# Patient Record
Sex: Female | Born: 1990 | Race: Black or African American | Hispanic: No | Marital: Single | Smoking: Never smoker
Health system: Southern US, Community
[De-identification: ages and names within clinical notes are randomized; demographics above are authoritative.]

## PROBLEM LIST (undated history)

## (undated) ENCOUNTER — Emergency Department (HOSPITAL_BASED_OUTPATIENT_CLINIC_OR_DEPARTMENT_OTHER): Admission: EM | Payer: Medicaid Other | Source: Home / Self Care

## (undated) DIAGNOSIS — K219 Gastro-esophageal reflux disease without esophagitis: Secondary | ICD-10-CM

## (undated) DIAGNOSIS — D649 Anemia, unspecified: Secondary | ICD-10-CM

## (undated) DIAGNOSIS — J358 Other chronic diseases of tonsils and adenoids: Secondary | ICD-10-CM

## (undated) HISTORY — PX: TUBAL LIGATION: SHX77

## (undated) HISTORY — PX: TOOTH EXTRACTION: SUR596

## (undated) HISTORY — PX: WISDOM TOOTH EXTRACTION: SHX21

---

## 2013-10-28 ENCOUNTER — Encounter (HOSPITAL_BASED_OUTPATIENT_CLINIC_OR_DEPARTMENT_OTHER): Payer: Self-pay | Admitting: Emergency Medicine

## 2013-10-28 ENCOUNTER — Emergency Department (HOSPITAL_BASED_OUTPATIENT_CLINIC_OR_DEPARTMENT_OTHER)
Admission: EM | Admit: 2013-10-28 | Discharge: 2013-10-28 | Disposition: A | Discharge status: Home / Self Care | Payer: Medicaid Other | Attending: Emergency Medicine | Admitting: Emergency Medicine

## 2013-10-28 DIAGNOSIS — O9989 Other specified diseases and conditions complicating pregnancy, childbirth and the puerperium: Secondary | ICD-10-CM | POA: Insufficient documentation

## 2013-10-28 DIAGNOSIS — O0910 Supervision of pregnancy with history of ectopic or molar pregnancy, unspecified trimester: Secondary | ICD-10-CM | POA: Insufficient documentation

## 2013-10-28 DIAGNOSIS — O0912 Supervision of pregnancy with history of ectopic or molar pregnancy, second trimester: Secondary | ICD-10-CM

## 2013-10-28 DIAGNOSIS — R5381 Other malaise: Secondary | ICD-10-CM | POA: Insufficient documentation

## 2013-10-28 DIAGNOSIS — R5383 Other fatigue: Secondary | ICD-10-CM

## 2013-10-28 DIAGNOSIS — R110 Nausea: Secondary | ICD-10-CM | POA: Insufficient documentation

## 2013-10-28 DIAGNOSIS — R638 Other symptoms and signs concerning food and fluid intake: Secondary | ICD-10-CM | POA: Insufficient documentation

## 2013-10-28 LAB — URINALYSIS, ROUTINE W REFLEX MICROSCOPIC
BILIRUBIN URINE: NEGATIVE
Glucose, UA: NEGATIVE mg/dL
Hgb urine dipstick: NEGATIVE
KETONES UR: 15 mg/dL — AB
LEUKOCYTES UA: NEGATIVE
NITRITE: NEGATIVE
PH: 7 (ref 5.0–8.0)
Protein, ur: NEGATIVE mg/dL
Specific Gravity, Urine: 1.026 (ref 1.005–1.030)
UROBILINOGEN UA: 2 mg/dL — AB (ref 0.0–1.0)

## 2013-10-28 LAB — PREGNANCY, URINE: Preg Test, Ur: POSITIVE — AB

## 2013-10-28 NOTE — ED Notes (Signed)
MD at bedside.  Using ultrasound machine to verify age of fetus.  Single pregnancy, approximately 18 weeks in uterus.

## 2013-10-28 NOTE — Discharge Instructions (Signed)
Avoid alcohol or drugs. Tylenol for pain. Return for vaginal bleeding, passing out, abdominal pain or new concerns. See your OB this week.  If you were given medicines take as directed.  If you are on coumadin or contraceptives realize their levels and effectiveness is altered by many different medicines.  If you have any reaction (rash, tongues swelling, other) to the medicines stop taking and see a physician.   Please follow up as directed and return to the ER or see a physician for new or worsening symptoms.  Thank you. Filed Vitals:   10/28/13 1441  BP: 102/68  Pulse: 100  Temp: 97.8 F (36.6 C)  TempSrc: Oral  Resp: 18  SpO2: 100%

## 2013-10-28 NOTE — ED Provider Notes (Signed)
CSN: [id number]     Arrival date & time 10/28/13  1249 History   First MD Initiated Contact with Patient 10/28/13 1322     Chief Complaint  Patient presents with  . possibly pregnant, past tubal pregnancy      (Consider location/radiation/quality/duration/timing/severity/associated sxs/prior Treatment) HPI Comments: 23 yo female with ectopic pregnancy requiring medicine, C section hx presents with concern for pregnancy due to abdominal growth and nausea/ fatigue for the past few weeks.  No home preg test.  No abdo pain or bleeding.   No passing out.  Pt feels okay otherwise.  The history is provided by the patient.    History reviewed. No pertinent past medical history. History reviewed. No pertinent past surgical history. No family history on file. History  Substance Use Topics  . Smoking status: Never Smoker   . Smokeless tobacco: Not on file  . Alcohol Use: Not on file   OB History   Grav Para Term Preterm Abortions TAB SAB Ect Mult Living                 Review of Systems  Constitutional: Positive for appetite change and fatigue. Negative for fever and chills.  HENT: Negative for congestion.   Eyes: Negative for visual disturbance.  Respiratory: Negative for shortness of breath.   Cardiovascular: Negative for chest pain.  Gastrointestinal: Positive for nausea. Negative for vomiting and abdominal pain.  Genitourinary: Negative for dysuria.  Musculoskeletal: Negative for back pain, neck pain and neck stiffness.  Skin: Negative for rash.  Neurological: Negative for light-headedness and headaches.      Allergies  Review of patient's allergies indicates no known allergies.  Home Medications  No current outpatient prescriptions on file. BP 102/68  Pulse 100  Temp(Src) 97.8 F (36.6 C) (Oral)  Resp 18  SpO2 100%  LMP 08/19/2013 Physical Exam  Nursing note and vitals reviewed. Constitutional: She appears well-developed and well-nourished.  HENT:  Head:  Normocephalic and atraumatic.  Eyes: Conjunctivae are normal. Right eye exhibits no discharge. Left eye exhibits no discharge.  Neck: Normal range of motion. Neck supple. No tracheal deviation present.  Cardiovascular: Regular rhythm.   Pulmonary/Chest: Effort normal.  Abdominal: Soft. She exhibits no distension (gravid). There is no tenderness. There is no guarding.  Musculoskeletal: She exhibits no edema.  Neurological: She is alert.  Skin: Skin is warm. No rash noted.  Psychiatric: She has a normal mood and affect.    ED Course  Procedures (including critical care time) EMERGENCY DEPARTMENT US PREGNANCY "Study: Limited Ultrasound of the Pelvis for Pregnancy"  INDICATIONS:Pregnancy(required) nausea Multiple views of the uterus and pelvic cavity were obtained in real-time with a multi-frequency probe.  APPROACH:Transabdominal   PERFORMED BY: Myself  IMAGES ARCHIVED?: Yes   PREGNANCY FREE FLUID: None  ADNEXAL FINDINGS: no mass seen in adnexal region, difficult due to 2nd trimester pregnancy  PREGNANCY FINDINGS: Intrauterine gestational sac noted and Fetal heart activity seen  INTERPRETATION: Viable intrauterine pregnancy  GESTATIONAL AGE, ESTIMATE: 18 wks  FETAL HEART RATE: 160s     Labs Review Labs Reviewed  URINALYSIS, ROUTINE W REFLEX MICROSCOPIC - Abnormal; Notable for the following:    Ketones, ur 15 (*)    Urobilinogen, UA 2.0 (*)    All other components within normal limits  PREGNANCY, URINE - Abnormal; Notable for the following:    Preg Test, Ur POSITIVE (*)    All other components within normal limits   Imaging Review No results found.   EKG Interpretation  None      MDM   Final diagnoses:  Pregnancy in second trimester with history of ectopic pregnancy    Well appearing. Preg test positive, new pregnancy. No concern for ectopic at this time after bedside US showed normal intrauterine preg 18 wks, normal FHR.  Fup with OB.  Results and  differential diagnosis were discussed with the patient. Close follow up outpatient was discussed, patient comfortable with the plan.   Filed Vitals:   10/28/13 1441  BP: 102/68  Pulse: 100  Temp: 97.8 F (36.6 C)  TempSrc: Oral  Resp: 18  SpO2: 100%      Enid Skeens, MD 10/28/13 1450

## 2013-10-28 NOTE — ED Notes (Signed)
Patient here requesting pregnancy test, has history of tubal pregnancy. Reports fatigue and some vomiting.

## 2015-08-16 ENCOUNTER — Emergency Department (HOSPITAL_BASED_OUTPATIENT_CLINIC_OR_DEPARTMENT_OTHER)
Admission: EM | Admit: 2015-08-16 | Discharge: 2015-08-16 | Disposition: A | Discharge status: Home / Self Care | Payer: Medicaid Other | Attending: Emergency Medicine | Admitting: Emergency Medicine

## 2015-08-16 ENCOUNTER — Encounter (HOSPITAL_BASED_OUTPATIENT_CLINIC_OR_DEPARTMENT_OTHER): Payer: Self-pay | Admitting: Emergency Medicine

## 2015-08-16 DIAGNOSIS — Z862 Personal history of diseases of the blood and blood-forming organs and certain disorders involving the immune mechanism: Secondary | ICD-10-CM | POA: Diagnosis not present

## 2015-08-16 DIAGNOSIS — Z3202 Encounter for pregnancy test, result negative: Secondary | ICD-10-CM | POA: Diagnosis not present

## 2015-08-16 DIAGNOSIS — A5901 Trichomonal vulvovaginitis: Secondary | ICD-10-CM | POA: Diagnosis not present

## 2015-08-16 DIAGNOSIS — N898 Other specified noninflammatory disorders of vagina: Secondary | ICD-10-CM | POA: Diagnosis present

## 2015-08-16 HISTORY — DX: Anemia, unspecified: D64.9

## 2015-08-16 LAB — WET PREP, GENITAL
Clue Cells Wet Prep HPF POC: NONE SEEN
Sperm: NONE SEEN
Yeast Wet Prep HPF POC: NONE SEEN

## 2015-08-16 LAB — PREGNANCY, URINE: Preg Test, Ur: NEGATIVE

## 2015-08-16 MED ORDER — AZITHROMYCIN 250 MG PO TABS
1000.0000 mg | ORAL_TABLET | Freq: Once | ORAL | Status: AC
  Administered 2015-08-16: 1000 mg via ORAL
  Filled 2015-08-16: qty 4

## 2015-08-16 MED ORDER — CEFTRIAXONE SODIUM 250 MG IJ SOLR
250.0000 mg | Freq: Once | INTRAMUSCULAR | Status: AC
  Administered 2015-08-16: 250 mg via INTRAMUSCULAR
  Filled 2015-08-16: qty 250

## 2015-08-16 MED ORDER — METRONIDAZOLE 500 MG PO TABS
2000.0000 mg | ORAL_TABLET | Freq: Once | ORAL | Status: AC
  Administered 2015-08-16: 2000 mg via ORAL
  Filled 2015-08-16: qty 4

## 2015-08-16 MED ORDER — LIDOCAINE HCL (PF) 1 % IJ SOLN
2.0000 mL | Freq: Once | INTRAMUSCULAR | Status: AC
  Administered 2015-08-16: 2 mL
  Filled 2015-08-16: qty 5

## 2015-08-16 NOTE — Discharge Instructions (Signed)
Trichomoniasis Trichomoniasis is an infection caused by an organism called Trichomonas. The infection can affect both women and men. In women, the outer female genitalia and the vagina are affected. In men, the penis is mainly affected, but the prostate and other reproductive organs can also be involved. Trichomoniasis is a sexually transmitted infection (STI) and is most often passed to another person through sexual contact.  RISK FACTORS  Having unprotected sexual intercourse.  Having sexual intercourse with an infected partner. SIGNS AND SYMPTOMS  Symptoms of trichomoniasis in women include:  Abnormal gray-green frothy vaginal discharge.  Itching and irritation of the vagina.  Itching and irritation of the area outside the vagina. Symptoms of trichomoniasis in men include:   Penile discharge with or without pain.  Pain during urination. This results from inflammation of the urethra. DIAGNOSIS  Trichomoniasis may be found during a Pap test or physical exam. Your health care provider may use one of the following methods to help diagnose this infection:  Testing the pH of the vagina with a test tape.  Using a vaginal swab test that checks for the Trichomonas organism. A test is available that provides results within a few minutes.  Examining a urine sample.  Testing vaginal secretions. Your health care provider may test you for other STIs, including HIV. TREATMENT   You may be given medicine to fight the infection. Women should inform their health care provider if they could be or are pregnant. Some medicines used to treat the infection should not be taken during pregnancy.  Your health care provider may recommend over-the-counter medicines or creams to decrease itching or irritation.  Your sexual partner will need to be treated if infected.  Your health care provider may test you for infection again 3 months after treatment. HOME CARE INSTRUCTIONS   Take medicines only as  directed by your health care provider.  Take over-the-counter medicine for itching or irritation as directed by your health care provider.  Do not have sexual intercourse while you have the infection.  Women should not douche or wear tampons while they have the infection.  Discuss your infection with your partner. Your partner may have gotten the infection from you, or you may have gotten it from your partner.  Have your sex partner get examined and treated if necessary.  Practice safe, informed, and protected sex.  See your health care provider for other STI testing. SEEK MEDICAL CARE IF:   You still have symptoms after you finish your medicine.  You develop abdominal pain.  You have pain when you urinate.  You have bleeding after sexual intercourse.  You develop a rash.  Your medicine makes you sick or makes you throw up (vomit). MAKE SURE YOU:  Understand these instructions.  Will watch your condition.  Will get help right away if you are not doing well or get worse.   This information is not intended to replace advice given to you by your health care provider. Make sure you discuss any questions you have with your health care provider.   Document Released: 01/19/2001 Document Revised: 08/16/2014 Document Reviewed: 05/07/2013 Elsevier Interactive Patient Education 2016 [employer]  

## 2015-08-16 NOTE — ED Provider Notes (Addendum)
CSN: [id number]     Arrival date & time 08/16/15  0135 History   First MD Initiated Contact with Patient 08/16/15 0202     Chief Complaint  Patient presents with  . Foreign Body in Vagina     (Consider location/radiation/quality/duration/timing/severity/associated sxs/prior Treatment) HPI  This is a 25 year old female who is here with her cousin. Her cousin has a retained tampon in her vagina which was removed by myself a few minutes ago. She became concerned that she may have a retained tampon herself. She denies abnormal odor or discharge to me although she did mention abnormal odor to her nurse. She denies pain, nausea, vomiting or diarrhea.  Past Medical History  Diagnosis Date  . Anemia    Past Surgical History  Procedure Laterality Date  . Cesarean section     History reviewed. No pertinent family history. Social History  Substance Use Topics  . Smoking status: Never Smoker   . Smokeless tobacco: None  . Alcohol Use: Yes   OB History    Gravida Para Term Preterm AB TAB SAB Ectopic Multiple Living   7 6 5 1 1   1        Review of Systems  All other systems reviewed and are negative.   Allergies  Review of patient's allergies indicates no known allergies.  Home Medications   Prior to Admission medications   Not on File   BP 119/92 mmHg  Pulse 92  Temp(Src) 98.9 F (37.2 C) (Oral)  Resp 16  Ht 5\' 3"  (1.6 m)  Wt 127 lb (57.607 kg)  BMI 22.50 kg/m2  SpO2 100%   Physical Exam  General: Well-developed, well-nourished female in no acute distress; appearance consistent with age of record HENT: normocephalic; atraumatic Eyes: pupils equal, round and reactive to light; extraocular muscles intact Neck: supple Heart: regular rate and rhythm Lungs: clear to auscultation bilaterally Abdomen: soft; nondistended; nontender; no masses or hepatosplenomegaly; bowel sounds present GU: Normal external genitalia; physiologic appearing vaginal discharge; no vaginal  bleeding; no cervical motion tenderness; no cervical erythema; no adnexal tenderness Extremities: No deformity; full range of motion; pulses normal Neurologic: Awake, alert and oriented; motor function intact in all extremities and symmetric; no facial droop Skin: Warm and dry Psychiatric: Normal mood and affect    ED Course  Procedures (including critical care time)   MDM  Nursing notes and vitals signs, including pulse oximetry, reviewed.  Summary of this visit's results, reviewed by myself:  Labs:  Results for orders placed or performed during the hospital encounter of 08/16/15 (from the past 24 hour(s))  Wet prep, genital     Status: Abnormal   Collection Time: 08/16/15  2:00 AM  Result Value Ref Range   Yeast Wet Prep HPF POC NONE SEEN NONE SEEN   Trich, Wet Prep PRESENT (A) NONE SEEN   Clue Cells Wet Prep HPF POC NONE SEEN NONE SEEN   WBC, Wet Prep HPF POC MANY (A) NONE SEEN   Sperm NONE SEEN   Pregnancy, urine     Status: None   Collection Time: 08/16/15  2:30 AM  Result Value Ref Range   Preg Test, Ur NEGATIVE NEGATIVE      Paula LibraJohn Konnor Jorden, MD 08/16/15 [id number]  Paula LibraJohn Akash Winski, MD 08/16/15 [id number]

## 2015-08-16 NOTE — ED Notes (Signed)
Pt thinks she may have part of a tampoon in her vagina because of change in vaginal odor. Denies pain, discharge, or bleeding

## 2015-08-18 LAB — GC/CHLAMYDIA PROBE AMP (~~LOC~~) NOT AT ARMC
Chlamydia: NEGATIVE
Neisseria Gonorrhea: NEGATIVE

## 2015-08-18 LAB — RPR: RPR Ser Ql: NONREACTIVE

## 2015-08-18 LAB — HIV ANTIBODY (ROUTINE TESTING W REFLEX): HIV SCREEN 4TH GENERATION: NONREACTIVE

## 2016-02-12 ENCOUNTER — Emergency Department (HOSPITAL_BASED_OUTPATIENT_CLINIC_OR_DEPARTMENT_OTHER)
Admission: EM | Admit: 2016-02-12 | Discharge: 2016-02-12 | Disposition: A | Discharge status: Home / Self Care | Payer: Medicaid Other | Attending: Emergency Medicine | Admitting: Emergency Medicine

## 2016-02-12 ENCOUNTER — Encounter (HOSPITAL_BASED_OUTPATIENT_CLINIC_OR_DEPARTMENT_OTHER): Payer: Self-pay

## 2016-02-12 DIAGNOSIS — R5383 Other fatigue: Secondary | ICD-10-CM

## 2016-02-12 DIAGNOSIS — R42 Dizziness and giddiness: Secondary | ICD-10-CM | POA: Diagnosis present

## 2016-02-12 LAB — BASIC METABOLIC PANEL WITH GFR
Anion gap: 8 (ref 5–15)
BUN: 10 mg/dL (ref 6–20)
CO2: 23 mmol/L (ref 22–32)
Calcium: 9.2 mg/dL (ref 8.9–10.3)
Chloride: 106 mmol/L (ref 101–111)
Creatinine, Ser: 0.67 mg/dL (ref 0.44–1.00)
GFR calc Af Amer: >60 mL/min
GFR calc non Af Amer: >60 mL/min
Glucose, Bld: 97 mg/dL (ref 65–99)
Potassium: 3.4 mmol/L — ABNORMAL LOW (ref 3.5–5.1)
Sodium: 137 mmol/L (ref 135–145)

## 2016-02-12 LAB — URINALYSIS, ROUTINE W REFLEX MICROSCOPIC
Bilirubin Urine: NEGATIVE
Glucose, UA: NEGATIVE mg/dL
Hgb urine dipstick: NEGATIVE
KETONES UR: NEGATIVE mg/dL
LEUKOCYTES UA: NEGATIVE
NITRITE: NEGATIVE
PH: 7.5 (ref 5.0–8.0)
Protein, ur: NEGATIVE mg/dL
Specific Gravity, Urine: 1.022 (ref 1.005–1.030)

## 2016-02-12 LAB — CBC WITH DIFFERENTIAL/PLATELET
Basophils Absolute: 0 K/uL (ref 0.0–0.1)
Basophils Relative: 0 %
Eosinophils Absolute: 0.1 K/uL (ref 0.0–0.7)
Eosinophils Relative: 1 %
HCT: 30.3 % — ABNORMAL LOW (ref 36.0–46.0)
Hemoglobin: 9.5 g/dL — ABNORMAL LOW (ref 12.0–15.0)
Lymphocytes Relative: 27 %
Lymphs Abs: 2.3 K/uL (ref 0.7–4.0)
MCH: 23.7 pg — ABNORMAL LOW (ref 26.0–34.0)
MCHC: 31.4 g/dL (ref 30.0–36.0)
MCV: 75.6 fL — ABNORMAL LOW (ref 78.0–100.0)
Monocytes Absolute: 0.6 K/uL (ref 0.1–1.0)
Monocytes Relative: 7 %
Neutro Abs: 5.6 K/uL (ref 1.7–7.7)
Neutrophils Relative %: 65 %
Platelets: 409 K/uL — ABNORMAL HIGH (ref 150–400)
RBC: 4.01 MIL/uL (ref 3.87–5.11)
RDW: 17.8 % — ABNORMAL HIGH (ref 11.5–15.5)
WBC: 8.7 K/uL (ref 4.0–10.5)

## 2016-02-12 LAB — PREGNANCY, URINE: Preg Test, Ur: NEGATIVE

## 2016-02-12 MED ORDER — SODIUM CHLORIDE 0.9 % IV BOLUS (SEPSIS)
500.0000 mL | Freq: Once | INTRAVENOUS | Status: AC
  Administered 2016-02-12: 500 mL via INTRAVENOUS

## 2016-02-12 NOTE — ED Provider Notes (Signed)
CSN: [id number]     Arrival date & time 02/12/16  1132 History   First MD Initiated Contact with Patient 02/12/16 1241     Chief Complaint  Patient presents with  . Dizziness     (Consider location/radiation/quality/duration/timing/severity/associated sxs/prior Treatment) HPI   Tempie DonningJasmine Ergle is a 25 yo female with past medical history of anemia who presents to the ED today with multiple complaints. She states that she feels like she is dehydrated. When asked what she means by that patient responds by saying "I have had intermittent headaches and when I drink water they go away." She does not currently have a headache. Patient also states she has a history of anemia and takes iron supplements for this. Her last dose was yesterday. She also reports feeling very fatigued. She denies any shortness of breath or loss of consciousness. Lastly, patient states that her right index finger has been intermittently feeling numb for the last 2 days. She denies any trauma or injury to that area. She describes the numbness as feeling like "it is asleep". She denies any fevers, chills, nausea, vomiting, diarrhea, dizziness, blurry vision, neck pain or stiffness.  Past Medical History  Diagnosis Date  . Anemia    Past Surgical History  Procedure Laterality Date  . Cesarean section     No family history on file. Social History  Substance Use Topics  . Smoking status: Never Smoker   . Smokeless tobacco: None  . Alcohol Use: Yes     Comment: occ   OB History    Gravida Para Term Preterm AB TAB SAB Ectopic Multiple Living   7 6 5 1 1   1        Review of Systems  All other systems reviewed and are negative.     Allergies  Review of patient's allergies indicates no known allergies.  Home Medications   Prior to Admission medications   Medication Sig Start Date End Date Taking? Authorizing Provider  IRON PO Take by mouth.   Yes Historical Provider, MD   BP 113/74 mmHg  Pulse 84  Temp(Src)  98.8 F (37.1 C) (Oral)  Resp 16  Ht 5\' 3"  (1.6 m)  Wt 69.854 kg  BMI 27.29 kg/m2  SpO2 100%  LMP 01/29/2016 Physical Exam  Constitutional: She is oriented to person, place, and time. She appears well-developed and well-nourished. No distress.  HENT:  Head: Normocephalic and atraumatic.  Mouth/Throat: No oropharyngeal exudate.  Eyes: Conjunctivae and EOM are normal. Pupils are equal, round, and reactive to light. Right eye exhibits no discharge. Left eye exhibits no discharge. No scleral icterus.  Cardiovascular: Normal rate, regular rhythm, normal heart sounds and intact distal pulses.  Exam reveals no gallop and no friction rub.   No murmur heard. Pulmonary/Chest: Effort normal and breath sounds normal. No respiratory distress. She has no wheezes. She has no rales. She exhibits no tenderness.  Abdominal: Soft. She exhibits no distension. There is no tenderness. There is no guarding.  Musculoskeletal: Normal range of motion. She exhibits no edema.  Good capillary refill.   Neurological: She is alert and oriented to person, place, and time. No cranial nerve deficit. Coordination normal.  Strength 5/5 throughout. No sensory deficits.     Skin: Skin is warm and dry. No rash noted. She is not diaphoretic. No erythema. No pallor.  Psychiatric: She has a normal mood and affect. Her behavior is normal.  Nursing note and vitals reviewed.   ED Course  Procedures (including critical  care time) Labs Review Labs Reviewed  CBC WITH DIFFERENTIAL/PLATELET - Abnormal; Notable for the following:    Hemoglobin 9.5 (*)    HCT 30.3 (*)    MCV 75.6 (*)    MCH 23.7 (*)    RDW 17.8 (*)    Platelets 409 (*)    All other components within normal limits  BASIC METABOLIC PANEL - Abnormal; Notable for the following:    Potassium 3.4 (*)    All other components within normal limits  PREGNANCY, URINE  URINALYSIS, ROUTINE W REFLEX MICROSCOPIC (NOT AT [facility])    Imaging Review No results found. I  have personally reviewed and evaluated these images and lab results as part of my medical decision-making.   EKG Interpretation None      MDM   Final diagnoses:  Other fatigue   25 y.o F with a pmhx of anemia presents to the ED c/o fatigue and "feeling dehydrated". Pt appears well in ED, in NAD. All VSS. No pallor. No neurological deficits. No clinical signs of dehydration. Good skin turgor. Mucous membranes are moist. No metabolic derangement. Normal specific gravity and creatinine. No lab evidence of dehydration. Hgb is 9.5. Upon review of pts records this is increased from previous lab results. Pts baseline is around 9. No indication for transfusion. Pt was given 500cc bolus of NS in ED and reports symptomatic improvement. Feel that pt is safe for discharge with PCP follow up. Recommend that pt continue home iron supplementation.   Pt also c/o R 1st digit intermittent numbness x 4 days. However, no sensory deficits on exam. Finger is not currently numb. No discoloration. Good cap refill. NO hx of injury. Do not feel imaging is indicated at this point. Recommend follow up with PCP.     Lester KinsmanSamantha Tripp MarquetteDowless, PA-C 02/14/16 [id number]  Lavera Guiseana Duo Liu, MD 02/17/16 [id number]

## 2016-02-12 NOTE — ED Notes (Addendum)
C/o lightheaded, nausea, numbness to right index finger x 4 days-NAD-texting in triage-steady gait

## 2016-02-12 NOTE — Discharge Instructions (Signed)
Fatigue Fatigue is feeling tired all of the time, a lack of energy, or a lack of motivation. Occasional or mild fatigue is often a normal response to activity or life in general. However, long-lasting (chronic) or extreme fatigue may indicate an underlying medical condition. HOME CARE INSTRUCTIONS  Watch your fatigue for any changes. The following actions may help to lessen any discomfort you are feeling:  Talk to your health care provider about how much sleep you need each night. Try to get the required amount every night.  Take medicines only as directed by your health care provider.  Eat a healthy and nutritious diet. Ask your health care provider if you need help changing your diet.  Drink enough fluid to keep your urine clear or pale yellow.  Practice ways of relaxing, such as yoga, meditation, massage therapy, or acupuncture.  Exercise regularly.   Change situations that cause you stress. Try to keep your work and personal routine reasonable.  Do not abuse illegal drugs.  Limit alcohol intake to no more than 1 drink per day for nonpregnant women and 2 drinks per day for men. One drink equals 12 ounces of beer, 5 ounces of wine, or 1 ounces of hard liquor.  Take a multivitamin, if directed by your health care provider. SEEK MEDICAL CARE IF:   Your fatigue does not get better.  You have a fever.   You have unintentional weight loss or gain.  You have headaches.   You have difficulty:   Falling asleep.  Sleeping throughout the night.  You feel angry, guilty, anxious, or sad.   You are unable to have a bowel movement (constipation).   You skin is dry.   Your legs or another part of your body is swollen.  SEEK IMMEDIATE MEDICAL CARE IF:   You feel confused.   Your vision is blurry.  You feel faint or pass out.   You have a severe headache.   You have severe abdominal, pelvic, or back pain.   You have chest pain, shortness of breath, or an  irregular or fast heartbeat.   You are unable to urinate or you urinate less than normal.   You develop abnormal bleeding, such as bleeding from the rectum, vagina, nose, lungs, or nipples.  You vomit blood.   You have thoughts about harming yourself or committing suicide.   You are worried that you might harm someone else.    This information is not intended to replace advice given to you by your health care provider. Make sure you discuss any questions you have with your health care provider.   Continue home iron supplements. Encourage adequate hydration, drink plenty of fluids. Follow up with your PCP as needed. Return to the ED if you experience severe worsening of your symptoms, loss of consciousness, difficulty breathing, change in vision.

## 2016-11-03 ENCOUNTER — Emergency Department (HOSPITAL_BASED_OUTPATIENT_CLINIC_OR_DEPARTMENT_OTHER)
Admission: EM | Admit: 2016-11-03 | Discharge: 2016-11-04 | Disposition: A | Discharge status: Home / Self Care | Payer: Medicaid Other | Attending: Emergency Medicine | Admitting: Emergency Medicine

## 2016-11-03 ENCOUNTER — Encounter (HOSPITAL_BASED_OUTPATIENT_CLINIC_OR_DEPARTMENT_OTHER): Payer: Self-pay

## 2016-11-03 DIAGNOSIS — M545 Low back pain: Secondary | ICD-10-CM

## 2016-11-03 DIAGNOSIS — Y999 Unspecified external cause status: Secondary | ICD-10-CM | POA: Insufficient documentation

## 2016-11-03 DIAGNOSIS — Y92410 Unspecified street and highway as the place of occurrence of the external cause: Secondary | ICD-10-CM | POA: Insufficient documentation

## 2016-11-03 DIAGNOSIS — Y939 Activity, unspecified: Secondary | ICD-10-CM | POA: Insufficient documentation
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
## 2017-03-10 ENCOUNTER — Emergency Department (HOSPITAL_COMMUNITY)
Admission: EM | Admit: 2017-03-10 | Discharge: 2017-03-10 | Disposition: A | Discharge status: Home / Self Care | Payer: Medicaid Other | Attending: Physician Assistant | Admitting: Physician Assistant

## 2017-03-10 ENCOUNTER — Encounter (HOSPITAL_COMMUNITY): Payer: Self-pay | Admitting: Emergency Medicine

## 2017-03-10 DIAGNOSIS — T50905A Adverse effect of unspecified drugs, medicaments and biological substances, initial encounter: Secondary | ICD-10-CM

## 2017-03-10 DIAGNOSIS — N390 Urinary tract infection, site not specified: Secondary | ICD-10-CM | POA: Insufficient documentation

## 2017-03-10 DIAGNOSIS — N3001 Acute cystitis with hematuria: Secondary | ICD-10-CM

## 2017-03-10 LAB — POC URINE PREG, ED: Preg Test, Ur: NEGATIVE

## 2017-03-10 LAB — URINALYSIS, ROUTINE W REFLEX MICROSCOPIC
BACTERIA UA: NONE SEEN
BILIRUBIN URINE: NEGATIVE
Glucose, UA: NEGATIVE mg/dL
Ketones, ur: 20 mg/dL — AB
Nitrite: NEGATIVE
PH: 7 (ref 5.0–8.0)
Protein, ur: NEGATIVE mg/dL
Specific Gravity, Urine: 1.003 — ABNORMAL LOW (ref 1.005–1.030)

## 2017-03-10 LAB — CBC WITH DIFFERENTIAL/PLATELET
Basophils Absolute: 0 10*3/uL (ref 0.0–0.1)
Basophils Relative: 0 %
EOS ABS: 0.1 10*3/uL (ref 0.0–0.7)
EOS PCT: 1 %
HCT: 31.8 % — ABNORMAL LOW (ref 36.0–46.0)
Hemoglobin: 10.2 g/dL — ABNORMAL LOW (ref 12.0–15.0)
LYMPHS ABS: 1.7 10*3/uL (ref 0.7–4.0)
LYMPHS PCT: 20 %
MCH: 24.2 pg — AB (ref 26.0–34.0)
MCHC: 32.1 g/dL (ref 30.0–36.0)
MCV: 75.4 fL — AB (ref 78.0–100.0)
MONOS PCT: 8 %
Monocytes Absolute: 0.7 10*3/uL (ref 0.1–1.0)
Neutro Abs: 6.1 10*3/uL (ref 1.7–7.7)
Neutrophils Relative %: 71 %
PLATELETS: 371 10*3/uL (ref 150–400)
RBC: 4.22 MIL/uL (ref 3.87–5.11)
RDW: 17.3 % — ABNORMAL HIGH (ref 11.5–15.5)
WBC: 8.5 10*3/uL (ref 4.0–10.5)

## 2017-03-10 LAB — COMPREHENSIVE METABOLIC PANEL
ALT: 11 U/L — AB (ref 14–54)
ANION GAP: 9 (ref 5–15)
AST: 22 U/L (ref 15–41)
Albumin: 4 g/dL (ref 3.5–5.0)
Alkaline Phosphatase: 65 U/L (ref 38–126)
BUN: 9 mg/dL (ref 6–20)
CO2: 22 mmol/L (ref 22–32)
CREATININE: 0.72 mg/dL (ref 0.44–1.00)
Calcium: 9.1 mg/dL (ref 8.9–10.3)
Chloride: 106 mmol/L (ref 101–111)
GFR calc Af Amer: >60 mL/min (ref >60)
Glucose, Bld: 93 mg/dL (ref 65–99)
Potassium: 3.2 mmol/L — ABNORMAL LOW (ref 3.5–5.1)
SODIUM: 137 mmol/L (ref 135–145)
TOTAL PROTEIN: 8.3 g/dL — AB (ref 6.5–8.1)
Total Bilirubin: 0.4 mg/dL (ref 0.3–1.2)

## 2017-03-10 MED ORDER — CEPHALEXIN 250 MG/5ML PO SUSR
500.0000 mg | Freq: Once | ORAL | Status: AC
  Administered 2017-03-10: 500 mg via ORAL
  Filled 2017-03-10: qty 10

## 2017-03-10 MED ORDER — NITROFURANTOIN MONOHYD MACRO 100 MG PO CAPS: 100.0000 mg | ORAL_CAPSULE | Freq: Two times a day (BID) | ORAL | 0 refills | Status: DC

## 2017-03-10 MED ORDER — NITROFURANTOIN MONOHYD MACRO 100 MG PO CAPS
100.0000 mg | ORAL_CAPSULE | Freq: Once | ORAL | Status: DC
  Filled 2017-03-10: qty 1

## 2017-03-10 MED ORDER — CEPHALEXIN 250 MG/5ML PO SUSR: 500.0000 mg | Freq: Four times a day (QID) | ORAL | 0 refills | Status: AC

## 2017-03-10 NOTE — Discharge Instructions (Signed)
Your hemoglobin is 10.1. You should continue to take iron pills at home.

## 2017-03-10 NOTE — ED Notes (Signed)
Bed: WA17 Expected date:  Expected time:  Means of arrival:  Comments: EMS 

## 2017-03-10 NOTE — ED Provider Notes (Signed)
[facility]-EMERGENCY DEPT Provider Note   CSN: [id number] Arrival date & time: 03/10/17  1633     History   Chief Complaint Chief Complaint  Patient presents with  . Medication Reaction    HPI Kristi Shelton is a 26 y.o. female.  HPI   Patient's a 26 year old female presenting with urinary tract symptoms. She reports that she had burning with urination. She reports that she took her friend's amoxicillin. She took it at 11 AM, 6 hours ago. She reports 2 hours later she had chest tightness and numbness of all extremities short of breath. She reports it resolved after couple hours. She reports no itching no rash.  Past Medical History:  Diagnosis Date  . Anemia     There are no active problems to display for this patient.   Past Surgical History:  Procedure Laterality Date  . CESAREAN SECTION    . TOOTH EXTRACTION    . WISDOM TOOTH EXTRACTION      OB History    Gravida Para Term Preterm AB Living   7 6 5 1 1      SAB TAB Ectopic Multiple Live Births       1           Home Medications    Prior to Admission medications   Medication Sig Start Date End Date Taking? Authorizing Provider  amoxicillin (AMOXIL) 250 MG chewable tablet Chew 1 mg by mouth once.   Yes [provider]    Family History No family history on file.  Social History Social History  Substance Use Topics  . Smoking status: Never Smoker  . Smokeless tobacco: Never Used  . Alcohol use No     Comment: weekly     Allergies   Patient has no known allergies.   Review of Systems Review of Systems  Constitutional: Negative for activity change.  Respiratory: Negative for shortness of breath.   Cardiovascular: Negative for chest pain.  Gastrointestinal: Negative for abdominal pain.     Physical Exam Updated Vital Signs BP 109/70 (BP Location: Right Arm)   Pulse 60   Temp 97.9 F (36.6 C) (Oral)   Resp 18   Ht 5\' 2"  (1.575 m)   Wt 69.9 kg (154 lb)   SpO2 100%   BMI 28.17  kg/m   Physical Exam  Constitutional: She is oriented to person, place, and time. She appears well-developed and well-nourished.  HENT:  Head: Normocephalic and atraumatic.  Eyes: Right eye exhibits no discharge.  Cardiovascular: Normal rate, regular rhythm and normal heart sounds.   No murmur heard. Pulmonary/Chest: Effort normal and breath sounds normal. She has no wheezes. She has no rales.  Abdominal: Soft. She exhibits no distension. There is no tenderness.  Neurological: She is oriented to person, place, and time.  Skin: Skin is warm and dry. She is not diaphoretic.  Psychiatric: She has a normal mood and affect.  Nursing note and vitals reviewed.    ED Treatments / Results  Labs (all labs ordered are listed, but only abnormal results are displayed) Labs Reviewed  URINALYSIS, ROUTINE W REFLEX MICROSCOPIC  CBC WITH DIFFERENTIAL/PLATELET  COMPREHENSIVE METABOLIC PANEL  POC URINE PREG, ED    EKG  EKG Interpretation None       Radiology No results found.  Procedures Procedures (including critical care time)  Medications Ordered in ED Medications - No data to display   Initial Impression / Assessment and Plan / ED Course  I have reviewed the triage vital  signs and the nursing notes.  Pertinent labs & imaging results that were available during my care of the patient were reviewed by me and considered in my medical decision making (see chart for details).     Patient's healthy appearing 26 year old female presenting with reaction to amoxicillin. She reports that she thinks she had allergic reaction. Sounds very atypical for an allergic reaction given there is no rash or itching involved. We will check her urine and prescribe a different antibiotic. Patient requesting us to do blood work as well because she is concerned that she has a low hemoglobin because she's had in the past.  Patient has mildly low hemoglobin (better than usual) total encourage her to continue  to her iron pills and recheck. Also patient has urinary tract infection. She reports she cannot take pills we'll prescribe liquid Keflex.  Final Clinical Impressions(s) / ED Diagnoses   Final diagnoses:  None    New Prescriptions New Prescriptions   No medications on file     Abelino DerrickMackuen, Anwar Sakata Lyn, MD 03/10/17 2227

## 2017-03-10 NOTE — ED Triage Notes (Signed)
Per EMS, pt is coming from home with complaints of a medication allergic reaction. Pt took 250 mg amoxicillin approx. 5 hours ago. Pt reports that the medication belonged to a friend and she started taking it because she suspected a UTI. Pt reports symptoms of foul smelling urine. Pt states she has had SOB, dry throat, and lightheadedness. EMS reports lung sounds clear in all fields. No hives. No wheezing.

## 2017-09-06 ENCOUNTER — Other Ambulatory Visit: Payer: Self-pay

## 2017-09-06 ENCOUNTER — Emergency Department (HOSPITAL_COMMUNITY)
Admission: EM | Admit: 2017-09-06 | Discharge: 2017-09-06 | Disposition: A | Discharge status: Home / Self Care | Payer: Medicaid Other | Attending: Emergency Medicine | Admitting: Emergency Medicine

## 2017-09-06 ENCOUNTER — Encounter (HOSPITAL_COMMUNITY): Payer: Self-pay | Admitting: *Deleted

## 2017-09-06 ENCOUNTER — Emergency Department (HOSPITAL_COMMUNITY): Payer: Medicaid Other

## 2017-09-06 DIAGNOSIS — D649 Anemia, unspecified: Secondary | ICD-10-CM | POA: Diagnosis not present

## 2017-09-06 DIAGNOSIS — J181 Lobar pneumonia, unspecified organism: Secondary | ICD-10-CM | POA: Diagnosis not present

## 2017-09-06 DIAGNOSIS — J189 Pneumonia, unspecified organism: Secondary | ICD-10-CM

## 2017-09-06 DIAGNOSIS — Z79899 Other long term (current) drug therapy: Secondary | ICD-10-CM | POA: Insufficient documentation

## 2017-09-06 DIAGNOSIS — R0600 Dyspnea, unspecified: Secondary | ICD-10-CM | POA: Diagnosis present

## 2017-09-06 LAB — URINALYSIS, ROUTINE W REFLEX MICROSCOPIC
Bilirubin Urine: NEGATIVE
Glucose, UA: NEGATIVE mg/dL
Ketones, ur: NEGATIVE mg/dL
Leukocytes, UA: NEGATIVE
Nitrite: NEGATIVE
Protein, ur: NEGATIVE mg/dL
Specific Gravity, Urine: 1.024 (ref 1.005–1.030)
pH: 6 (ref 5.0–8.0)

## 2017-09-06 LAB — WET PREP, GENITAL
Clue Cells Wet Prep HPF POC: NONE SEEN
SPERM: NONE SEEN
Trich, Wet Prep: NONE SEEN
Yeast Wet Prep HPF POC: NONE SEEN

## 2017-09-06 LAB — CBC
HCT: 34.3 % — ABNORMAL LOW (ref 36.0–46.0)
Hemoglobin: 10.7 g/dL — ABNORMAL LOW (ref 12.0–15.0)
MCH: 24.3 pg — ABNORMAL LOW (ref 26.0–34.0)
MCHC: 31.2 g/dL (ref 30.0–36.0)
MCV: 77.8 fL — ABNORMAL LOW (ref 78.0–100.0)
Platelets: 366 K/uL (ref 150–400)
RBC: 4.41 MIL/uL (ref 3.87–5.11)
RDW: 17.4 % — ABNORMAL HIGH (ref 11.5–15.5)
WBC: 9 K/uL (ref 4.0–10.5)

## 2017-09-06 LAB — I-STAT BETA HCG BLOOD, ED (MC, WL, AP ONLY): I-stat hCG, quantitative: <5 m[IU]/mL

## 2017-09-06 MED ORDER — DOXYCYCLINE HYCLATE 100 MG PO CAPS: 100.0000 mg | ORAL_CAPSULE | Freq: Two times a day (BID) | ORAL | 0 refills | Status: AC

## 2017-09-06 MED ORDER — FERROUS SULFATE 325 (65 FE) MG PO TABS: 325.0000 mg | ORAL_TABLET | Freq: Every day | ORAL | 0 refills | Status: DC

## 2017-09-06 NOTE — Discharge Instructions (Signed)
Your x-ray showed a developing right middle lobe pneumonia.  Please take 1 tablet of doxycycline twice daily for the next 5 days.  Take 1 tablet of ferrous sulfate (iron) daily for the next 30 days.  These call and schedule a follow-up in the next month with your OB/GYN.  They may want to recheck your hemoglobin level.  You may also want to discuss different types of birth control if your periods continue to be heavier.  Take 600 mg of ibuprofen with food every 6 hours as needed to help with abdominal cramping. There are several anti-gas products available over the counter to help with increased belching and gas, such as Gas-X  Your gonorrhea and Chlamydia test are pending.  If 1 of these tests are positive, someone from the hospital will call you with a number that you gave registration.  You can also download the app MyChart if you have a smartphone and these results should be available in a few days.   If you develop new or worsening symptoms, including dizziness or lightheadedness, worsening shortness of breath or chest pain, or other new concerning symptoms, please return to the emergency department for reevaluation.

## 2017-09-06 NOTE — ED Notes (Signed)
Pelvic cart at pt's bedside. 

## 2017-09-06 NOTE — ED Notes (Signed)
Pt had steady gait, while ambulating to the bathroom. 

## 2017-09-06 NOTE — ED Notes (Signed)
Pelvic cart at bedside. 

## 2017-09-06 NOTE — ED Notes (Signed)
Pelvic exam done by Mia - PA and Kenney Housemananya - EMT assisted

## 2017-09-06 NOTE — ED Triage Notes (Signed)
Pt is here stating her breathing is acting up, concern for uti or std, and thinks when her menstrual came on it was everywhere.  Pt is having pain in her chest and having a gassy feeling, dizziness, and dry mouth.  Pt reports vaginal odor.

## 2017-09-06 NOTE — ED Provider Notes (Signed)
[facility] EMERGENCY DEPARTMENT Provider Note   CSN: [id number] Arrival date & time: 09/06/17  1030     History   Chief Complaint Chief Complaint  Patient presents with  . Multiple Complaints    HPI Kristi Shelton is a 27 y.o. female with a h/o of anemia who presents to the emergency department with multiple complaints.  She endorses intermittent dyspnea over the last week that is worse at night and when she is laying flat on her right side and is alleviated by standing up and walking around.  The pain radiates to her right mid back when she lays on her right side.  She reports associated chest tightness, intermittently productive cough, and worsening pica. She has been eating ice constantly ovre the last week.  She denies palpitations, chest pain, dizziness, lightheadedness, headache,   She also complains of heavy vaginal bleeding x4 days. She has been passing clots.  She is unsure how many pads that she has been using because she changes them frequently. she reports associated abdominal cramping,and increased belching, and flatulence.  She reports malodorous vaginal discharge 4 days ago that has since resolved.  She denies dysuria, frequency, hematuria, dyspareunia, hematochezia, or melena.   The history is provided by the patient. No language interpreter was used.    Past Medical History:  Diagnosis Date  . Anemia     There are no active problems to display for this patient.   Past Surgical History:  Procedure Laterality Date  . CESAREAN SECTION    . TOOTH EXTRACTION    . WISDOM TOOTH EXTRACTION      OB History    Gravida Para Term Preterm AB Living   7 6 5 1 1      SAB TAB Ectopic Multiple Live Births       1           Home Medications    Prior to Admission medications   Medication Sig Start Date End Date Taking? Authorizing Provider  amoxicillin (AMOXIL) 250 MG chewable tablet Chew 1 mg by mouth once.    [provider]    doxycycline (VIBRAMYCIN) 100 MG capsule Take 1 capsule (100 mg total) by mouth 2 (two) times daily for 5 days. 09/06/17 09/11/17  Jannice Beitzel A, PA-C  ferrous sulfate 325 (65 FE) MG tablet Take 1 tablet (325 mg total) by mouth daily. 09/06/17   Kingston Guiles A, PA-C  nitrofurantoin, macrocrystal-monohydrate, (MACROBID) 100 MG capsule Take 1 capsule (100 mg total) by mouth 2 (two) times daily. 03/10/17   Mackuen, Cindee Salt, MD    Family History No family history on file.  Social History Social History   Tobacco Use  . Smoking status: Never Smoker  . Smokeless tobacco: Never Used  Substance Use Topics  . Alcohol use: No    Comment: weekly  . Drug use: No     Allergies   Penicillins   Review of Systems Review of Systems  Constitutional: Negative for activity change.  Respiratory: Positive for cough, chest tightness and shortness of breath.   Cardiovascular: Negative for chest pain.  Gastrointestinal: Positive for abdominal pain and diarrhea. Negative for constipation, nausea and vomiting.  Genitourinary: Positive for vaginal bleeding.  Musculoskeletal: Positive for back pain.  Skin: Negative for rash.   Physical Exam Updated Vital Signs BP 128/64 (BP Location: Right Arm)   Pulse 79   Temp 98.2 F (36.8 C) (Oral)   Resp 18   LMP 09/02/2017   SpO2  100%   Physical Exam  Constitutional: She appears well-developed and well-nourished. No distress.  HENT:  Head: Normocephalic.  Right Ear: Tympanic membrane and ear canal normal.  Left Ear: Tympanic membrane and ear canal normal.  Nose: Rhinorrhea present.  Mouth/Throat: Uvula is midline, oropharynx is clear and moist and mucous membranes are normal.  Eyes: Conjunctivae are normal.  Neck: Normal range of motion. Neck supple.  Cardiovascular: Normal rate, regular rhythm, normal heart sounds and intact distal pulses. Exam reveals no gallop and no friction rub.  No murmur heard. Pulmonary/Chest: Effort normal. No stridor.  No respiratory distress. She has no wheezes. She has no rales.  Abdominal: Soft. Bowel sounds are normal. She exhibits no distension and no mass. There is no tenderness. There is no rebound and no guarding. No hernia.  Abdomen is soft, nontender, nondistended.  Normoactive bowel sounds.  Genitourinary: Uterus normal. There is no rash, tenderness or lesion on the right labia. There is no rash, tenderness or lesion on the left labia. Cervix exhibits no motion tenderness and no friability. Right adnexum displays no mass, no tenderness and no fullness. Left adnexum displays no mass, no tenderness and no fullness. There is bleeding in the vagina.  Genitourinary Comments: No external rash or lesions, edema, erythema, or warmth. No cervical motion tenderness.  No adnexal tenderness bilaterally. Dark red blood and several clots are noted in the vaginal vault.   Musculoskeletal: Normal range of motion. She exhibits no edema, tenderness or deformity.  Neurological: She is alert.  Skin: Skin is warm. No rash noted. She is not diaphoretic.  Psychiatric: Her behavior is normal.  Nursing note and vitals reviewed.    ED Treatments / Results  Labs (all labs ordered are listed, but only abnormal results are displayed) Labs Reviewed  WET PREP, GENITAL - Abnormal; Notable for the following components:      Result Value   WBC, Wet Prep HPF POC FEW (*)    All other components within normal limits  URINALYSIS, ROUTINE W REFLEX MICROSCOPIC - Abnormal; Notable for the following components:   Hgb urine dipstick MODERATE (*)    Bacteria, UA RARE (*)    Squamous Epithelial / LPF 0-5 (*)    All other components within normal limits  CBC - Abnormal; Notable for the following components:   Hemoglobin 10.7 (*)    HCT 34.3 (*)    MCV 77.8 (*)    MCH 24.3 (*)    RDW 17.4 (*)    All other components within normal limits  RPR  HIV ANTIBODY (ROUTINE TESTING)  I-STAT BETA HCG BLOOD, ED ([facility], [facility], [facility] ONLY)  GC/CHLAMYDIA  PROBE AMP ([place]    EKG  EKG Interpretation None       Radiology Dg Chest 2 View  Result Date: 09/06/2017 CLINICAL DATA:  Shortness of breath. EXAM: CHEST  2 VIEW COMPARISON:  No prior. FINDINGS: Mediastinum and hilar structures normal. Heart size normal. Mild infiltrate right mid lung field cannot be completely excluded. No pleural effusion or pneumothorax. Heart size normal. IMPRESSION: Heart size normal. Mild infiltrate right mid lung field cannot be completely excluded. Exam otherwise unremarkable. Electronically Signed   By: Maisie Fushomas  Register   On: 09/06/2017 11:46    Procedures Procedures (including critical care time)  Medications Ordered in ED Medications - No data to display   Initial Impression / Assessment and Plan / ED Course  I have reviewed the triage vital signs and the nursing notes.  Pertinent labs & imaging results that were available during my care of the patient were reviewed by me and considered in my medical decision making (see chart for details).     27 year old female with a history of anemia presenting with multiple complaints including dyspnea, vaginal bleeding, abdominal cramping, flatulence, belching, and malodorous vaginal discharged.  Chest x-ray demonstrating possible developing right middle lobe pneumonia, which corresponds clinically with the patient's symptoms. EKS with NSR. Will discharge the patient with a 5-day course of doxycycline.  Pelvic exam is unremarkable other than vaginal bleeding.  Wet prep with few WBCs, but otherwise negative. HIV, RPR, GC/chlamdyia are pending.  Discussed treatment for gonorrhea and chlamydia with the patient who declines at this time.  Discussed this someone from the hospital will call her if the results are positive. Doubt PID at this time.   Microcytic anemia with hemoglobin of 10.7.  Last hemoglobin 10.2.  We will discharge the patient with ferrous sulfate and recommended follow-up to her  OB/GYN for re-evaluation in the next 30 days.   Strict return precautions given.  No acute distress.  The patient is safe for discharge at this time.   Final Clinical Impressions(s) / ED Diagnoses   Final diagnoses:  Community acquired pneumonia of right middle lobe of lung (HCC)  Anemia, unspecified type    ED Discharge Orders        Ordered    doxycycline (VIBRAMYCIN) 100 MG capsule  2 times daily     09/06/17 1451    ferrous sulfate 325 (65 FE) MG tablet  Daily     09/06/17 1451       Lanesha Azzaro A, PA-C 09/06/17 1501    Tilden Fossa, MD 09/06/17 [phone]

## 2017-09-07 LAB — GC/CHLAMYDIA PROBE AMP (~~LOC~~) NOT AT ARMC
Chlamydia: NEGATIVE
Neisseria Gonorrhea: NEGATIVE

## 2017-09-07 LAB — RPR: RPR Ser Ql: NONREACTIVE

## 2017-09-07 LAB — HIV ANTIBODY (ROUTINE TESTING W REFLEX): HIV Screen 4th Generation wRfx: NONREACTIVE

## 2017-12-18 ENCOUNTER — Encounter (HOSPITAL_COMMUNITY): Payer: Self-pay

## 2017-12-18 ENCOUNTER — Emergency Department (HOSPITAL_COMMUNITY)
Admission: EM | Admit: 2017-12-18 | Discharge: 2017-12-18 | Disposition: A | Discharge status: Home / Self Care | Payer: Medicaid Other | Attending: Emergency Medicine | Admitting: Emergency Medicine

## 2017-12-18 ENCOUNTER — Other Ambulatory Visit: Payer: Self-pay

## 2017-12-18 ENCOUNTER — Emergency Department (HOSPITAL_COMMUNITY): Payer: Medicaid Other

## 2017-12-18 DIAGNOSIS — K5900 Constipation, unspecified: Secondary | ICD-10-CM | POA: Insufficient documentation

## 2017-12-18 DIAGNOSIS — R1084 Generalized abdominal pain: Secondary | ICD-10-CM

## 2017-12-18 LAB — URINALYSIS, ROUTINE W REFLEX MICROSCOPIC
Bilirubin Urine: NEGATIVE
Glucose, UA: NEGATIVE mg/dL
Hgb urine dipstick: NEGATIVE
Ketones, ur: NEGATIVE mg/dL
Nitrite: NEGATIVE
Protein, ur: NEGATIVE mg/dL
Specific Gravity, Urine: 1.002 — ABNORMAL LOW (ref 1.005–1.030)
pH: 7 (ref 5.0–8.0)

## 2017-12-18 LAB — CBC WITH DIFFERENTIAL/PLATELET
Basophils Absolute: 0 10*3/uL (ref 0.0–0.1)
Basophils Relative: 0 %
Eosinophils Absolute: 0.2 10*3/uL (ref 0.0–0.7)
Eosinophils Relative: 2 %
HCT: 30.9 % — ABNORMAL LOW (ref 36.0–46.0)
Hemoglobin: 9.6 g/dL — ABNORMAL LOW (ref 12.0–15.0)
Lymphocytes Relative: 21 %
Lymphs Abs: 1.9 10*3/uL (ref 0.7–4.0)
MCH: 24.1 pg — ABNORMAL LOW (ref 26.0–34.0)
MCHC: 31.1 g/dL (ref 30.0–36.0)
MCV: 77.4 fL — ABNORMAL LOW (ref 78.0–100.0)
Monocytes Absolute: 0.6 10*3/uL (ref 0.1–1.0)
Monocytes Relative: 6 %
Neutro Abs: 6.5 10*3/uL (ref 1.7–7.7)
Neutrophils Relative %: 71 %
Platelets: 325 10*3/uL (ref 150–400)
RBC: 3.99 MIL/uL (ref 3.87–5.11)
RDW: 16.9 % — ABNORMAL HIGH (ref 11.5–15.5)
WBC: 9.2 10*3/uL (ref 4.0–10.5)

## 2017-12-18 LAB — COMPREHENSIVE METABOLIC PANEL
ALT: 12 U/L — ABNORMAL LOW (ref 14–54)
AST: 21 U/L (ref 15–41)
Albumin: 3.7 g/dL (ref 3.5–5.0)
Alkaline Phosphatase: 61 U/L (ref 38–126)
Anion gap: 10 (ref 5–15)
BUN: 6 mg/dL (ref 6–20)
CO2: 22 mmol/L (ref 22–32)
Calcium: 9.2 mg/dL (ref 8.9–10.3)
Chloride: 106 mmol/L (ref 101–111)
Creatinine, Ser: 0.74 mg/dL (ref 0.44–1.00)
GFR calc Af Amer: >60 mL/min (ref >60)
GFR calc non Af Amer: >60 mL/min (ref >60)
Glucose, Bld: 105 mg/dL — ABNORMAL HIGH (ref 65–99)
Potassium: 3.5 mmol/L (ref 3.5–5.1)
Sodium: 138 mmol/L (ref 135–145)
Total Bilirubin: 0.8 mg/dL (ref 0.3–1.2)
Total Protein: 8 g/dL (ref 6.5–8.1)

## 2017-12-18 LAB — POC URINE PREG, ED: Preg Test, Ur: NEGATIVE

## 2017-12-18 MED ORDER — KETOROLAC TROMETHAMINE 30 MG/ML IJ SOLN
30.0000 mg | Freq: Once | INTRAMUSCULAR | Status: AC
  Administered 2017-12-18: 30 mg via INTRAVENOUS
  Filled 2017-12-18: qty 1

## 2017-12-18 MED ORDER — SODIUM CHLORIDE 0.9 % IV BOLUS
1000.0000 mL | Freq: Once | INTRAVENOUS | Status: AC
  Administered 2017-12-18: 1000 mL via INTRAVENOUS

## 2017-12-18 MED ORDER — DOCUSATE SODIUM 100 MG PO CAPS: 100.0000 mg | ORAL_CAPSULE | Freq: Two times a day (BID) | ORAL | 0 refills | Status: DC

## 2017-12-18 MED ORDER — BISACODYL 10 MG RE SUPP: 10.0000 mg | RECTAL | 0 refills | Status: DC | PRN

## 2017-12-18 MED ORDER — PEG 3350-KCL-NABCB-NACL-NASULF 236 G PO SOLR: 4000.0000 mL | Freq: Once | ORAL | 0 refills | Status: AC

## 2017-12-18 NOTE — ED Triage Notes (Signed)
She c/o constipation x 3 days. She also c/o urinary hesitancy. She is in no distress.

## 2017-12-18 NOTE — Discharge Instructions (Addendum)
Return here as needed. Follow up with your doctor. Your CT scan shows constipation. You do not have any signs of urinary tract infection at this time. I did send the urine for a culture.

## 2017-12-18 NOTE — ED Notes (Signed)
She is just now back from CT.

## 2017-12-18 NOTE — ED Provider Notes (Signed)
[facility]-EMERGENCY DEPT Provider Note   CSN: [id number] Arrival date & time: 12/18/17  4782     History   Chief Complaint Chief Complaint  Patient presents with  . Abdominal Pain    HPI Kristi Shelton is a 27 y.o. female.  HPI Patient presents to the emergency department with chief complaint of constipation x3 days along with some urinary discomfort.  The patient states she is also having some right-sided lower back discomfort associated with this.  The patient states that nothing seems to make the condition better or worse.  Patient states that she did not take any medications prior to arrival for her symptoms.  The patient denies chest pain, shortness of breath, headache,blurred vision, neck pain, fever, cough, weakness, numbness, dizziness, anorexia, edema,  nausea, vomiting, diarrhea, rash, back pain, dysuria, hematemesis, bloody stool, near syncope, or syncope. Past Medical History:  Diagnosis Date  . Anemia     There are no active problems to display for this patient.   Past Surgical History:  Procedure Laterality Date  . CESAREAN SECTION    . TOOTH EXTRACTION    . WISDOM TOOTH EXTRACTION       OB History    Gravida  7   Para  6   Term  5   Preterm  1   AB  1   Living        SAB      TAB      Ectopic  1   Multiple      Live Births               Home Medications    Prior to Admission medications   Medication Sig Start Date End Date Taking? Authorizing Provider  aspirin EC 325 MG tablet Take 325 mg by mouth every 6 (six) hours as needed for moderate pain.   Yes [provider]  ferrous sulfate 325 (65 FE) MG tablet Take 1 tablet (325 mg total) by mouth daily. Patient not taking: Reported on 12/18/2017 09/06/17   McDonald, Pedro Earls A, PA-C  nitrofurantoin, macrocrystal-monohydrate, (MACROBID) 100 MG capsule Take 1 capsule (100 mg total) by mouth 2 (two) times daily. Patient not taking: Reported on 12/18/2017 03/10/17    Abelino Derrick, MD    Family History No family history on file.  Social History Social History   Tobacco Use  . Smoking status: Never Smoker  . Smokeless tobacco: Never Used  Substance Use Topics  . Alcohol use: No    Comment: weekly  . Drug use: No     Allergies   Penicillins   Review of Systems Review of Systems All other systems negative except as documented in the HPI. All pertinent positives and negatives as reviewed in the HPI.  Physical Exam Updated Vital Signs BP 122/81   Pulse (!) 108   Temp 98 F (36.7 C) (Oral)   Resp (!) 22   LMP  (LMP Unknown) Comment: Neg preg test  SpO2 100%   Physical Exam  Constitutional: She is oriented to person, place, and time. She appears well-developed and well-nourished. No distress.  HENT:  Head: Normocephalic and atraumatic.  Mouth/Throat: Oropharynx is clear and moist.  Eyes: Pupils are equal, round, and reactive to light.  Neck: Normal range of motion. Neck supple.  Cardiovascular: Normal rate, regular rhythm and normal heart sounds. Exam reveals no gallop and no friction rub.  No murmur heard. Pulmonary/Chest: Effort normal and breath sounds normal. No respiratory distress.  She has no wheezes.  Abdominal: Soft. Bowel sounds are normal. She exhibits no distension. There is generalized tenderness.  Neurological: She is alert and oriented to person, place, and time. She exhibits normal muscle tone. Coordination normal.  Skin: Skin is warm and dry. Capillary refill takes less than 2 seconds. No rash noted. No erythema.  Psychiatric: She has a normal mood and affect. Her behavior is normal.  Nursing note and vitals reviewed.    ED Treatments / Results  Labs (all labs ordered are listed, but only abnormal results are displayed) Labs Reviewed  COMPREHENSIVE METABOLIC PANEL - Abnormal; Notable for the following components:      Result Value   Glucose, Bld 105 (*)    ALT 12 (*)    All other components within  normal limits  CBC WITH DIFFERENTIAL/PLATELET - Abnormal; Notable for the following components:   Hemoglobin 9.6 (*)    HCT 30.9 (*)    MCV 77.4 (*)    MCH 24.1 (*)    RDW 16.9 (*)    All other components within normal limits  URINALYSIS, ROUTINE W REFLEX MICROSCOPIC - Abnormal; Notable for the following components:   Color, Urine STRAW (*)    Specific Gravity, Urine 1.002 (*)    Leukocytes, UA LARGE (*)    Bacteria, UA RARE (*)    All other components within normal limits  URINE CULTURE  POC URINE PREG, ED    EKG None  Radiology Ct Renal Stone Study  Result Date: 12/18/2017 CLINICAL DATA:  27 year old female with a history of constipation EXAM: CT ABDOMEN AND PELVIS WITHOUT CONTRAST TECHNIQUE: Multidetector CT imaging of the abdomen and pelvis was performed following the standard protocol without IV contrast. COMPARISON:  None. FINDINGS: Lower chest: No acute abnormality. Hepatobiliary: No focal liver abnormality is seen. No gallstones, gallbladder wall thickening, or biliary dilatation. Pancreas: Unremarkable pancreas Spleen: Unremarkable spleen Adrenals/Urinary Tract: Unremarkable appearance of the adrenal glands. No evidence of hydronephrosis of the right or left kidney. No nephrolithiasis. Unremarkable course of the bilateral ureters. Unremarkable appearance of the urinary bladder. Stomach/Bowel: Unremarkable appearance of stomach. Unremarkable small bowel. No abnormal distention. No transition point. Moderate stool burden. No evidence of obstruction. Normal appendix. Vascular/Lymphatic: No significant vascular calcifications. No adenopathy. Reproductive: Unremarkable appearance of the uterus and adnexa Other: No abdominal wall hernia or abnormality. No abdominopelvic ascites. Musculoskeletal: No acute or significant osseous findings. IMPRESSION: No acute CT finding. Moderate stool burden. Electronically Signed   By: Gilmer Mor D.O.   On: 12/18/2017 09:02    Procedures Procedures  (including critical care time)  Medications Ordered in ED Medications  sodium chloride 0.9 % bolus 1,000 mL (0 mLs Intravenous Stopped 12/18/17 1030)  ketorolac (TORADOL) 30 MG/ML injection 30 mg (30 mg Intravenous Given 12/18/17 1207)     Initial Impression / Assessment and Plan / ED Course  I have reviewed the triage vital signs and the nursing notes.  Pertinent labs & imaging results that were available during my care of the patient were reviewed by me and considered in my medical decision making (see chart for details).    Patient has noted moderate stool burden on her CT scan and there is no signs of urinary tract infection at this point I did culture it however.  I advised the patient to increase her fluid intake and return here for any worsening in her condition.  The patient did receive a liter of fluids here in the emergency department and we will treat  the constipation as well because she does feel like she has some gas buildup as her main source of pain.  Final Clinical Impressions(s) / ED Diagnoses   Final diagnoses:  None    ED Discharge Orders    None       Charlestine Night, PA-C 12/18/17 1225    Doug Sou, MD 12/18/17 [phone]

## 2017-12-19 LAB — URINE CULTURE: Culture: <10000 — AB

## 2018-05-02 ENCOUNTER — Encounter (HOSPITAL_BASED_OUTPATIENT_CLINIC_OR_DEPARTMENT_OTHER): Payer: Self-pay

## 2018-05-02 ENCOUNTER — Emergency Department (HOSPITAL_BASED_OUTPATIENT_CLINIC_OR_DEPARTMENT_OTHER)
Admission: EM | Admit: 2018-05-02 | Discharge: 2018-05-02 | Disposition: A | Discharge status: Home / Self Care | Payer: Medicaid Other | Attending: Emergency Medicine | Admitting: Emergency Medicine

## 2018-05-02 ENCOUNTER — Emergency Department (HOSPITAL_BASED_OUTPATIENT_CLINIC_OR_DEPARTMENT_OTHER): Payer: Medicaid Other

## 2018-05-02 DIAGNOSIS — J209 Acute bronchitis, unspecified: Secondary | ICD-10-CM | POA: Diagnosis not present

## 2018-05-02 DIAGNOSIS — A599 Trichomoniasis, unspecified: Secondary | ICD-10-CM

## 2018-05-02 DIAGNOSIS — R0981 Nasal congestion: Secondary | ICD-10-CM | POA: Diagnosis present

## 2018-05-02 LAB — URINALYSIS, ROUTINE W REFLEX MICROSCOPIC
GLUCOSE, UA: NEGATIVE mg/dL
HGB URINE DIPSTICK: NEGATIVE
Ketones, ur: 15 mg/dL — AB
NITRITE: NEGATIVE
PH: 6 (ref 5.0–8.0)
Protein, ur: NEGATIVE mg/dL
Specific Gravity, Urine: 1.025 (ref 1.005–1.030)

## 2018-05-02 LAB — WET PREP, GENITAL
Clue Cells Wet Prep HPF POC: NONE SEEN
SPERM: NONE SEEN
Yeast Wet Prep HPF POC: NONE SEEN

## 2018-05-02 LAB — URINALYSIS, MICROSCOPIC (REFLEX)

## 2018-05-02 LAB — PREGNANCY, URINE: Preg Test, Ur: NEGATIVE

## 2018-05-02 MED ORDER — METRONIDAZOLE 500 MG PO TABS: 500.0000 mg | ORAL_TABLET | Freq: Two times a day (BID) | ORAL | 0 refills | Status: DC

## 2018-05-02 NOTE — ED Triage Notes (Signed)
Pt states having productive cough, fever and nasal congestion; states also has a tonsil stone [id number], states also has a thick discharge so needs STD check

## 2018-05-02 NOTE — ED Provider Notes (Signed)
[facility] EMERGENCY DEPARTMENT Provider Note   CSN: [id number] Arrival date & time: 05/02/18  4098     History   Chief Complaint Chief Complaint  Patient presents with  . Nasal Congestion    HPI Kristi Shelton is a 27 y.o. female.  HPI Patient is a 27 year old female presents the emergency department 2 complaints  First complaint is that she has been having productive cough nasal congestion over the past 1 to 2 weeks.  She denies significant shortness of breath.  No fevers or chills.  No history of asthma.  Cough is productive.  Second complaint is new vaginal discharge over the past several days.  She feels as though there is a odor and more vaginal discharge.  No new sexual contacts.  Denies significant lower abdominal pain.  No fevers or chills.  No dysuria or urinary frequency.  Symptoms are mild to moderate in severity.   Past Medical History:  Diagnosis Date  . Anemia     There are no active problems to display for this patient.   Past Surgical History:  Procedure Laterality Date  . CESAREAN SECTION    . TOOTH EXTRACTION    . WISDOM TOOTH EXTRACTION       OB History    Gravida  7   Para  6   Term  5   Preterm  1   AB  1   Living        SAB      TAB      Ectopic  1   Multiple      Live Births               Home Medications    Prior to Admission medications   Medication Sig Start Date End Date Taking? Authorizing Provider  metroNIDAZOLE (FLAGYL) 500 MG tablet Take 1 tablet (500 mg total) by mouth 2 (two) times daily. 05/02/18   Azalia Bilis, MD    Family History No family history on file.  Social History Social History   Tobacco Use  . Smoking status: Never Smoker  . Smokeless tobacco: Never Used  Substance Use Topics  . Alcohol use: No    Comment: weekly  . Drug use: No     Allergies   Penicillins   Review of Systems Review of Systems  All other systems reviewed and are negative.    Physical  Exam Updated Vital Signs BP 110/74 (BP Location: Left Arm)   Pulse 89   Temp 98.2 F (36.8 C) (Oral)   Resp 18   Ht 5\' 3"  (1.6 m)   Wt 67.6 kg   LMP 04/27/2018   SpO2 100%   BMI 26.39 kg/m   Physical Exam  Constitutional: She is oriented to person, place, and time. She appears well-developed and well-nourished. No distress.  HENT:  Head: Normocephalic and atraumatic.  Eyes: EOM are normal.  Neck: Normal range of motion.  Cardiovascular: Normal rate, regular rhythm and normal heart sounds.  Pulmonary/Chest: Effort normal and breath sounds normal.  Abdominal: Soft. She exhibits no distension. There is no tenderness.  Genitourinary:  Genitourinary Comments: Chaperone present.  Normal external genitalia.  No cervical motion tenderness.  No gross cervical discharge  Musculoskeletal: Normal range of motion.  Neurological: She is alert and oriented to person, place, and time.  Skin: Skin is warm and dry.  Psychiatric: She has a normal mood and affect. Judgment normal.  Nursing note and vitals reviewed.    ED  Treatments / Results  Labs (all labs ordered are listed, but only abnormal results are displayed) Labs Reviewed  WET PREP, GENITAL - Abnormal; Notable for the following components:      Result Value   Trich, Wet Prep PRESENT (*)    WBC, Wet Prep HPF POC MANY (*)    All other components within normal limits  URINALYSIS, ROUTINE W REFLEX MICROSCOPIC - Abnormal; Notable for the following components:   Bilirubin Urine SMALL (*)    Ketones, ur 15 (*)    Leukocytes, UA TRACE (*)    All other components within normal limits  URINALYSIS, MICROSCOPIC (REFLEX) - Abnormal; Notable for the following components:   Bacteria, UA MANY (*)    All other components within normal limits  PREGNANCY, URINE  HIV ANTIBODY (ROUTINE TESTING W REFLEX)  RPR  GC/CHLAMYDIA PROBE AMP ([place]    EKG None  Radiology Dg Chest 2 View  Result Date: 05/02/2018 CLINICAL DATA:   Productive cough and fever. EXAM: CHEST - 2 VIEW COMPARISON:  09/06/2017 FINDINGS: The heart size and mediastinal contours are within normal limits. Both lungs are clear except for slight peribronchial thickening. No effusions. The visualized skeletal structures are unremarkable. IMPRESSION: Slight bronchitic changes. Electronically Signed   By: Francene BoyersJames  Maxwell M.D.   On: 05/02/2018 09:30    Procedures Procedures (including critical care time)  Medications Ordered in ED Medications - No data to display   Initial Impression / Assessment and Plan / ED Course  I have reviewed the triage vital signs and the nursing notes.  Pertinent labs & imaging results that were available during my care of the patient were reviewed by me and considered in my medical decision making (see chart for details).     Chest x-ray without evidence of pneumonia.  Likely acute bronchitis.  Patient also with evidence of trichomonal infection.  Home with prescription for Flagyl and standard precautions and follow-up instructions including instructions for sexual partners.  Final Clinical Impressions(s) / ED Diagnoses   Final diagnoses:  Acute bronchitis, unspecified organism  Trichomonas infection    ED Discharge Orders         Ordered    metroNIDAZOLE (FLAGYL) 500 MG tablet  2 times daily     05/02/18 1129           Azalia Bilisampos, Chanceler Pullin, MD 05/02/18 1131

## 2018-05-02 NOTE — ED Notes (Signed)
ED Provider at bedside. 

## 2018-05-03 LAB — GC/CHLAMYDIA PROBE AMP (~~LOC~~) NOT AT ARMC
Chlamydia: NEGATIVE
NEISSERIA GONORRHEA: NEGATIVE

## 2018-05-03 LAB — HIV ANTIBODY (ROUTINE TESTING W REFLEX): HIV SCREEN 4TH GENERATION: NONREACTIVE

## 2018-05-03 LAB — RPR: RPR Ser Ql: NONREACTIVE

## 2018-08-09 ENCOUNTER — Encounter (HOSPITAL_BASED_OUTPATIENT_CLINIC_OR_DEPARTMENT_OTHER): Payer: Self-pay | Admitting: Emergency Medicine

## 2018-08-09 ENCOUNTER — Other Ambulatory Visit: Payer: Self-pay

## 2018-08-09 ENCOUNTER — Emergency Department (HOSPITAL_BASED_OUTPATIENT_CLINIC_OR_DEPARTMENT_OTHER): Payer: Medicaid Other

## 2018-08-09 ENCOUNTER — Emergency Department (HOSPITAL_BASED_OUTPATIENT_CLINIC_OR_DEPARTMENT_OTHER)
Admission: EM | Admit: 2018-08-09 | Discharge: 2018-08-09 | Disposition: A | Discharge status: Home / Self Care | Payer: Medicaid Other | Attending: Emergency Medicine | Admitting: Emergency Medicine

## 2018-08-09 DIAGNOSIS — R059 Cough, unspecified: Secondary | ICD-10-CM

## 2018-08-09 DIAGNOSIS — N898 Other specified noninflammatory disorders of vagina: Secondary | ICD-10-CM | POA: Diagnosis not present

## 2018-08-09 DIAGNOSIS — R05 Cough: Secondary | ICD-10-CM | POA: Insufficient documentation

## 2018-08-09 DIAGNOSIS — A599 Trichomoniasis, unspecified: Secondary | ICD-10-CM | POA: Diagnosis not present

## 2018-08-09 DIAGNOSIS — R079 Chest pain, unspecified: Secondary | ICD-10-CM | POA: Diagnosis not present

## 2018-08-09 LAB — CBC
HCT: 33.3 % — ABNORMAL LOW (ref 36.0–46.0)
HEMOGLOBIN: 9.7 g/dL — AB (ref 12.0–15.0)
MCH: 22 pg — AB (ref 26.0–34.0)
MCHC: 29.1 g/dL — AB (ref 30.0–36.0)
MCV: 75.7 fL — AB (ref 80.0–100.0)
Platelets: 400 10*3/uL (ref 150–400)
RBC: 4.4 MIL/uL (ref 3.87–5.11)
RDW: 18.6 % — ABNORMAL HIGH (ref 11.5–15.5)
WBC: 7.1 10*3/uL (ref 4.0–10.5)
nRBC: 0 % (ref 0.0–0.2)

## 2018-08-09 LAB — URINALYSIS, MICROSCOPIC (REFLEX)

## 2018-08-09 LAB — WET PREP, GENITAL
Clue Cells Wet Prep HPF POC: NONE SEEN
SPERM: NONE SEEN
WBC, Wet Prep HPF POC: NONE SEEN
Yeast Wet Prep HPF POC: NONE SEEN

## 2018-08-09 LAB — BASIC METABOLIC PANEL
Anion gap: 10 (ref 5–15)
BUN: 6 mg/dL (ref 6–20)
CO2: 21 mmol/L — ABNORMAL LOW (ref 22–32)
CREATININE: 0.72 mg/dL (ref 0.44–1.00)
Calcium: 9.5 mg/dL (ref 8.9–10.3)
Chloride: 103 mmol/L (ref 98–111)
GFR calc Af Amer: >60 mL/min (ref >60)
GFR calc non Af Amer: >60 mL/min (ref >60)
GLUCOSE: 97 mg/dL (ref 70–99)
Potassium: 3.8 mmol/L (ref 3.5–5.1)
SODIUM: 134 mmol/L — AB (ref 135–145)

## 2018-08-09 LAB — URINALYSIS, ROUTINE W REFLEX MICROSCOPIC
Glucose, UA: NEGATIVE mg/dL
Ketones, ur: 40 mg/dL — AB
Leukocytes, UA: NEGATIVE
NITRITE: NEGATIVE
Protein, ur: 30 mg/dL — AB
Specific Gravity, Urine: >1.03 — ABNORMAL HIGH (ref 1.005–1.030)
pH: 6 (ref 5.0–8.0)

## 2018-08-09 LAB — PREGNANCY, URINE: Preg Test, Ur: NEGATIVE

## 2018-08-09 LAB — TROPONIN I

## 2018-08-09 MED ORDER — CEFTRIAXONE SODIUM 250 MG IJ SOLR
250.0000 mg | Freq: Once | INTRAMUSCULAR | Status: AC
  Administered 2018-08-09: 250 mg via INTRAMUSCULAR
  Filled 2018-08-09: qty 250

## 2018-08-09 MED ORDER — AZITHROMYCIN 1 G PO PACK
1.0000 g | PACK | Freq: Once | ORAL | Status: AC
  Administered 2018-08-09: 1 g via ORAL
  Filled 2018-08-09: qty 1

## 2018-08-09 MED ORDER — LIDOCAINE HCL (PF) 1 % IJ SOLN
INTRAMUSCULAR | Status: AC
  Administered 2018-08-09: 2.5 mL
  Filled 2018-08-09: qty 5

## 2018-08-09 MED ORDER — METRONIDAZOLE 50 MG/ML ORAL SUSPENSION: 2000.0000 mg | Freq: Once | ORAL | 0 refills | Status: AC

## 2018-08-09 MED ORDER — METRONIDAZOLE 500 MG PO TABS
2000.0000 mg | ORAL_TABLET | Freq: Once | ORAL | Status: DC
  Filled 2018-08-09: qty 4

## 2018-08-09 NOTE — ED Triage Notes (Signed)
Cough with chest and arm pain for several days. Also states she had a pos preg test a few weeks ago and now has dark vaginal bleeding.

## 2018-08-09 NOTE — ED Notes (Signed)
Pt verbalized understanding of dc instructions.

## 2018-08-09 NOTE — ED Notes (Signed)
Patient transported to X-ray 

## 2018-08-09 NOTE — ED Notes (Signed)
Patient stated she would like to be tested for an STD without doing a pelvic. EMT stated she would talk to the doctor

## 2018-08-09 NOTE — ED Notes (Signed)
ED Provider at bedside. 

## 2018-08-09 NOTE — ED Provider Notes (Signed)
[facility] EMERGENCY DEPARTMENT Provider Note   CSN: [id number] Arrival date & time: 08/09/18  2876     History   Chief Complaint Chief Complaint  Patient presents with  . Cough  . Vaginal Bleeding    HPI Kristi Shelton is a 28 y.o. female.  HPI   LMP 12/5, reports had pregnancy test, 12/14 had positive test. Cough for 2 days, chest pain feels like pressure, feel mucus in the back of throat. CP for one week and lower back pain. Also notes dyspnea.  Reports subj fevers for one week, will spike then go back down after a few minutes.  Ginger tea makes chest pain better.  Nose has been dry, no congestion. Throat dry.  Arm pain every now and then when laying down, arm stiffness.  Began to have dark vaginal bleeding started yesterday. Had vaginal discharge prior to bleeding with foul smell, not sure if STI.   No smoking, no hx of DVT or PE, no recent travel or immobilization, not on OCPs  Past Medical History:  Diagnosis Date  . Anemia     There are no active problems to display for this patient.   Past Surgical History:  Procedure Laterality Date  . CESAREAN SECTION    . TOOTH EXTRACTION    . WISDOM TOOTH EXTRACTION       OB History    Gravida  7   Para  6   Term  5   Preterm  1   AB  1   Living        SAB      TAB      Ectopic  1   Multiple      Live Births               Home Medications    Prior to Admission medications   Medication Sig Start Date End Date Taking? Authorizing Provider  metroNIDAZOLE (FLAGYL) 500 MG tablet Take 1 tablet (500 mg total) by mouth 2 (two) times daily. 05/02/18   Azalia Bilis, MD    Family History No family history on file.  Social History Social History   Tobacco Use  . Smoking status: Never Smoker  . Smokeless tobacco: Never Used  Substance Use Topics  . Alcohol use: No    Comment: weekly  . Drug use: No     Allergies   Penicillins   Review of Systems Review of Systems    Constitutional: Positive for fever.  HENT: Negative for sore throat.   Eyes: Negative for visual disturbance.  Respiratory: Positive for cough and shortness of breath.   Cardiovascular: Positive for chest pain.  Gastrointestinal: Positive for nausea. Negative for abdominal pain.  Genitourinary: Positive for vaginal bleeding and vaginal discharge. Negative for difficulty urinating.  Musculoskeletal: Positive for arthralgias and back pain. Negative for neck pain.  Skin: Negative for rash.  Neurological: Negative for syncope and headaches.     Physical Exam Updated Vital Signs BP 119/77 (BP Location: Left Arm)   Pulse 94   Temp 98.3 F (36.8 C) (Oral)   Resp 16   Ht 5\' 3"  (1.6 m)   Wt 60.3 kg   LMP 07/13/2018   SpO2 100%   BMI 23.56 kg/m   Physical Exam Vitals signs and nursing note reviewed.  Constitutional:      General: She is not in acute distress.    Appearance: She is well-developed. She is not diaphoretic.  HENT:     Head:  Normocephalic and atraumatic.  Eyes:     Conjunctiva/sclera: Conjunctivae normal.  Neck:     Musculoskeletal: Normal range of motion.  Cardiovascular:     Rate and Rhythm: Normal rate and regular rhythm.     Heart sounds: Normal heart sounds. No murmur. No friction rub. No gallop.   Pulmonary:     Effort: Pulmonary effort is normal. No respiratory distress.     Breath sounds: Normal breath sounds. No wheezing or rales.  Abdominal:     General: There is no distension.     Palpations: Abdomen is soft.     Tenderness: There is no abdominal tenderness. There is no guarding.  Musculoskeletal:        General: No tenderness.  Skin:    General: Skin is warm and dry.     Findings: No erythema or rash.  Neurological:     Mental Status: She is alert and oriented to person, place, and time.      ED Treatments / Results  Labs (all labs ordered are listed, but only abnormal results are displayed) Labs Reviewed  WET PREP, GENITAL - Abnormal;  Notable for the following components:      Result Value   Trich, Wet Prep PRESENT (*)    All other components within normal limits  URINALYSIS, ROUTINE W REFLEX MICROSCOPIC - Abnormal; Notable for the following components:   APPearance HAZY (*)    Specific Gravity, Urine >1.030 (*)    Hgb urine dipstick LARGE (*)    Bilirubin Urine SMALL (*)    Ketones, ur 40 (*)    Protein, ur 30 (*)    All other components within normal limits  URINALYSIS, MICROSCOPIC (REFLEX) - Abnormal; Notable for the following components:   Bacteria, UA MANY (*)    All other components within normal limits  CBC - Abnormal; Notable for the following components:   Hemoglobin 9.7 (*)    HCT 33.3 (*)    MCV 75.7 (*)    MCH 22.0 (*)    MCHC 29.1 (*)    RDW 18.6 (*)    All other components within normal limits  BASIC METABOLIC PANEL - Abnormal; Notable for the following components:   Sodium 134 (*)    CO2 21 (*)    All other components within normal limits  PREGNANCY, URINE  TROPONIN I  GC/CHLAMYDIA PROBE AMP ([place]) NOT AT [facility]    EKG EKG Interpretation  Date/Time:  Wednesday August 09 2018 08:01:47 EST Ventricular Rate:  80 PR Interval:    QRS Duration: 80 QT Interval:  369 QTC Calculation: 426 R Axis:   38 Text Interpretation:  Sinus rhythm No significant change since last tracing Confirmed by Alvira Monday ([id number]) on 08/09/2018 8:16:48 AM   Radiology Dg Chest 2 View  Result Date: 08/09/2018 CLINICAL DATA:  Patient with cough. EXAM: CHEST - 2 VIEW COMPARISON:  Chest radiograph 05/02/2018 FINDINGS: Monitoring leads overlie the patient. Normal cardiac and mediastinal contours. No consolidative pulmonary opacities. No pleural effusion or pneumothorax. Osseous structures unremarkable. IMPRESSION: No active cardiopulmonary disease. Electronically Signed   By: Annia Belt M.D.   On: 08/09/2018 08:31    Procedures Procedures (including critical care time)  Medications Ordered in  ED Medications  metroNIDAZOLE (FLAGYL) tablet 2,000 mg (has no administration in time range)  cefTRIAXone (ROCEPHIN) injection 250 mg (250 mg Intramuscular Given 08/09/18 0822)  azithromycin (ZITHROMAX) powder 1 g (1 g Oral Given 08/09/18 0822)  lidocaine (PF) (XYLOCAINE) 1 % injection (2.5  mLs  Given 08/09/18 [id number])     Initial Impression / Assessment and Plan / ED Course  I have reviewed the triage vital signs and the nursing notes.  Pertinent labs & imaging results that were available during my care of the patient were reviewed by me and considered in my medical decision making (see chart for details).     28 year old female with no significant medical history presents with concern for cough, chest pain, vaginal bleeding with positive present pregnancy test at home.    Regarding vaginal bleeding, patient has a negative pregnancy test here.  Given timing of LMP 12 5, feel this is likely her menses.  She also reports vaginal discharge.  She declines pelvic exam, but would like to do vaginal swabs to evaluate for STIs. Offered empiric treatment and she agrees. Clinically I have low suspicion for PID or TOA in the absence of abdominal pain. Urine without WBC, doubt infection.   Wet prep positive for trichomoniasis, given flagyl 2g.  Regarding chest pain and cough--she is PERC negative, low risk Wells, and have low suspicion for PE. EKG without signs of pericarditis or acute ischemic changes. Troponin negative. Chest XR shows no sign of pneumonia or pneumothorax.  Suspect likely viral infection. Recommend continued supportive care. Patient discharged in stable condition with understanding of reasons to return.   Final Clinical Impressions(s) / ED Diagnoses   Final diagnoses:  Cough  Chest pain, unspecified type  Vaginal discharge  Trichomoniasis    ED Discharge Orders    None       Alvira MondaySchlossman, Garrette Caine, MD 08/09/18 [phone]

## 2018-08-11 LAB — GC/CHLAMYDIA PROBE AMP (~~LOC~~) NOT AT ARMC
Chlamydia: NEGATIVE
NEISSERIA GONORRHEA: NEGATIVE

## 2018-08-23 ENCOUNTER — Other Ambulatory Visit: Payer: Self-pay

## 2018-08-23 ENCOUNTER — Emergency Department (HOSPITAL_BASED_OUTPATIENT_CLINIC_OR_DEPARTMENT_OTHER): Payer: Medicaid Other

## 2018-08-23 ENCOUNTER — Emergency Department (HOSPITAL_BASED_OUTPATIENT_CLINIC_OR_DEPARTMENT_OTHER)
Admission: EM | Admit: 2018-08-23 | Discharge: 2018-08-24 | Disposition: A | Discharge status: Home / Self Care | Payer: Medicaid Other | Attending: Emergency Medicine | Admitting: Emergency Medicine

## 2018-08-23 ENCOUNTER — Encounter (HOSPITAL_BASED_OUTPATIENT_CLINIC_OR_DEPARTMENT_OTHER): Payer: Self-pay

## 2018-08-23 DIAGNOSIS — Y998 Other external cause status: Secondary | ICD-10-CM | POA: Diagnosis not present

## 2018-08-23 DIAGNOSIS — S6992XA Unspecified injury of left wrist, hand and finger(s), initial encounter: Secondary | ICD-10-CM | POA: Diagnosis present

## 2018-08-23 DIAGNOSIS — Y929 Unspecified place or not applicable: Secondary | ICD-10-CM | POA: Insufficient documentation

## 2018-08-23 DIAGNOSIS — Y9389 Activity, other specified: Secondary | ICD-10-CM | POA: Insufficient documentation

## 2018-08-23 DIAGNOSIS — S52511A Displaced fracture of right radial styloid process, initial encounter for closed fracture: Secondary | ICD-10-CM | POA: Insufficient documentation

## 2018-08-23 DIAGNOSIS — W010XXA Fall on same level from slipping, tripping and stumbling without subsequent striking against object, initial encounter: Secondary | ICD-10-CM | POA: Insufficient documentation

## 2018-08-23 MED ORDER — ACETAMINOPHEN 325 MG PO TABS: 650.0000 mg | ORAL_TABLET | Freq: Once | ORAL | Status: DC

## 2018-08-23 MED ORDER — TRAMADOL HCL 50 MG PO TABS: 50.0000 mg | ORAL_TABLET | Freq: Four times a day (QID) | ORAL | 0 refills | Status: DC | PRN

## 2018-08-23 MED ORDER — ACETAMINOPHEN 160 MG/5ML PO SOLN
650.0000 mg | Freq: Once | ORAL | Status: AC
  Administered 2018-08-23: 650 mg via ORAL
  Filled 2018-08-23: qty 20.3

## 2018-08-23 MED ORDER — IBUPROFEN 800 MG PO TABS: 800.0000 mg | ORAL_TABLET | Freq: Three times a day (TID) | ORAL | 0 refills | Status: DC | PRN

## 2018-08-23 NOTE — Discharge Instructions (Signed)
Return here as needed.  Follow-up with the orthopedist provided.  Elevate your wrist.

## 2018-08-23 NOTE — ED Triage Notes (Signed)
Pt states she was playing around and fell backwards onto left hand, happened around 1530, has not had any medication

## 2018-08-23 NOTE — ED Provider Notes (Signed)
[facility] EMERGENCY DEPARTMENT Provider Note   CSN: [id number] Arrival date & time: 08/23/18  2210     History   Chief Complaint Chief Complaint  Patient presents with  . Fall    HPI Kristi Shelton is a 28 y.o. female.  HPI Patient presents to the emergency department with left wrist pain that started after falling.  The patient states she was playing with her kids and boyfriend when she fell backwards landing on her wrist.  The patient states she has increasing pain with range of motion and palpation.  Patient states that she did not take any medications prior to arrival for symptoms.  Patient states that she has no other injuries.  Denies numbness or weakness in the hand. Past Medical History:  Diagnosis Date  . Anemia     There are no active problems to display for this patient.   Past Surgical History:  Procedure Laterality Date  . CESAREAN SECTION    . TOOTH EXTRACTION    . WISDOM TOOTH EXTRACTION       OB History    Gravida  7   Para  6   Term  5   Preterm  1   AB  1   Living        SAB      TAB      Ectopic  1   Multiple      Live Births               Home Medications    Prior to Admission medications   Not on File    Family History No family history on file.  Social History Social History   Tobacco Use  . Smoking status: Never Smoker  . Smokeless tobacco: Never Used  Substance Use Topics  . Alcohol use: No    Comment: weekly  . Drug use: No     Allergies   Penicillins   Review of Systems Review of Systems All other systems negative except as documented in the HPI. All pertinent positives and negatives as reviewed in the HPI.  Physical Exam Updated Vital Signs BP 109/71 (BP Location: Right Arm)   Pulse 98   Temp 100.2 F (37.9 C) (Oral)   Resp 16   Ht 5\' 3"  (1.6 m)   Wt 60.3 kg   LMP 08/07/2018   SpO2 100%   BMI 23.55 kg/m   Physical Exam Vitals signs and nursing note reviewed.    Constitutional:      General: She is not in acute distress.    Appearance: She is well-developed.  HENT:     Head: Normocephalic and atraumatic.  Eyes:     Pupils: Pupils are equal, round, and reactive to light.  Pulmonary:     Effort: Pulmonary effort is normal.  Musculoskeletal:     Left wrist: She exhibits decreased range of motion, tenderness, bony tenderness and swelling. She exhibits no effusion, no crepitus, no deformity and no laceration.  Skin:    General: Skin is warm and dry.  Neurological:     Mental Status: She is alert and oriented to person, place, and time.      ED Treatments / Results  Labs (all labs ordered are listed, but only abnormal results are displayed) Labs Reviewed - No data to display  EKG None  Radiology No results found.  Procedures Procedures (including critical care time)  Medications Ordered in ED Medications  acetaminophen (TYLENOL) solution 650 mg (650 mg  Oral Given 08/23/18 2221)     Initial Impression / Assessment and Plan / ED Course  I have reviewed the triage vital signs and the nursing notes.  Pertinent labs & imaging results that were available during my care of the patient were reviewed by me and considered in my medical decision making (see chart for details).   Patient be treated for the radial styloid fracture.  I have her follow-up with hand.  Told to return here as needed.  Patient agrees the plan and all questions were answered.   Final Clinical Impressions(s) / ED Diagnoses   Final diagnoses:  None    ED Discharge Orders    None       Kyra Manges 08/23/18 2313    Azalia Bilis, MD 08/24/18 1506

## 2019-02-14 ENCOUNTER — Emergency Department (HOSPITAL_BASED_OUTPATIENT_CLINIC_OR_DEPARTMENT_OTHER)
Admission: EM | Admit: 2019-02-14 | Discharge: 2019-02-14 | Disposition: A | Discharge status: Home / Self Care | Payer: Medicaid Other | Attending: Emergency Medicine | Admitting: Emergency Medicine

## 2019-02-14 ENCOUNTER — Emergency Department (HOSPITAL_BASED_OUTPATIENT_CLINIC_OR_DEPARTMENT_OTHER): Payer: Medicaid Other

## 2019-02-14 ENCOUNTER — Encounter (HOSPITAL_BASED_OUTPATIENT_CLINIC_OR_DEPARTMENT_OTHER): Payer: Self-pay

## 2019-02-14 ENCOUNTER — Other Ambulatory Visit: Payer: Self-pay

## 2019-02-14 DIAGNOSIS — R0602 Shortness of breath: Secondary | ICD-10-CM | POA: Diagnosis present

## 2019-02-14 DIAGNOSIS — Z79899 Other long term (current) drug therapy: Secondary | ICD-10-CM | POA: Insufficient documentation

## 2019-02-14 DIAGNOSIS — E860 Dehydration: Secondary | ICD-10-CM

## 2019-02-14 HISTORY — DX: Other chronic diseases of tonsils and adenoids: J35.8

## 2019-02-14 LAB — CBC WITH DIFFERENTIAL/PLATELET
Abs Immature Granulocytes: 0.03 10*3/uL (ref 0.00–0.07)
Basophils Absolute: 0 10*3/uL (ref 0.0–0.1)
Basophils Relative: 0 %
Eosinophils Absolute: 0.1 10*3/uL (ref 0.0–0.5)
Eosinophils Relative: 1 %
HCT: 29.3 % — ABNORMAL LOW (ref 36.0–46.0)
Hemoglobin: 8.5 g/dL — ABNORMAL LOW (ref 12.0–15.0)
Immature Granulocytes: 0 %
Lymphocytes Relative: 17 %
Lymphs Abs: 1.6 10*3/uL (ref 0.7–4.0)
MCH: 21.1 pg — ABNORMAL LOW (ref 26.0–34.0)
MCHC: 29 g/dL — ABNORMAL LOW (ref 30.0–36.0)
MCV: 72.7 fL — ABNORMAL LOW (ref 80.0–100.0)
Monocytes Absolute: 0.8 10*3/uL (ref 0.1–1.0)
Monocytes Relative: 9 %
Neutro Abs: 6.7 10*3/uL (ref 1.7–7.7)
Neutrophils Relative %: 73 %
Platelets: 413 10*3/uL — ABNORMAL HIGH (ref 150–400)
RBC: 4.03 MIL/uL (ref 3.87–5.11)
RDW: 18.1 % — ABNORMAL HIGH (ref 11.5–15.5)
WBC: 9.3 10*3/uL (ref 4.0–10.5)
nRBC: 0 % (ref 0.0–0.2)

## 2019-02-14 LAB — BASIC METABOLIC PANEL
Anion gap: 8 (ref 5–15)
BUN: 14 mg/dL (ref 6–20)
CO2: 24 mmol/L (ref 22–32)
Calcium: 9 mg/dL (ref 8.9–10.3)
Chloride: 105 mmol/L (ref 98–111)
Creatinine, Ser: 0.65 mg/dL (ref 0.44–1.00)
GFR calc Af Amer: >60 mL/min (ref >60)
GFR calc non Af Amer: >60 mL/min (ref >60)
Glucose, Bld: 122 mg/dL — ABNORMAL HIGH (ref 70–99)
Potassium: 3.3 mmol/L — ABNORMAL LOW (ref 3.5–5.1)
Sodium: 137 mmol/L (ref 135–145)

## 2019-02-14 LAB — D-DIMER, QUANTITATIVE: D-Dimer, Quant: <0.27 ug/mL-FEU (ref 0.00–0.50)

## 2019-02-14 LAB — PREGNANCY, URINE: Preg Test, Ur: NEGATIVE

## 2019-02-14 MED ORDER — SODIUM CHLORIDE 0.9 % IV BOLUS
1000.0000 mL | Freq: Once | INTRAVENOUS | Status: AC
  Administered 2019-02-14: 1000 mL via INTRAVENOUS

## 2019-02-14 NOTE — ED Notes (Signed)
Pt on monitor 

## 2019-02-14 NOTE — ED Triage Notes (Signed)
Pt c/o SOB x 2-3 days-denies pain-states she feels dehydrated because she has been drinking alcohol daily x 1 week

## 2019-02-14 NOTE — Discharge Instructions (Signed)
Lab work today is unremarkable.  There is no infection on chest x-ray.  You do not have any significant anemia, electrolyte abnormality.  Suspect that you are mildly dehydrated from alcohol drinking.  Continue hydration at home.

## 2019-02-14 NOTE — ED Notes (Signed)
Pt reports recent ETOH binge and normally does not consume alcohol. Pt reports feeling SOB. Pt tachycardic. Pt skin warm to touch- dry.

## 2019-02-14 NOTE — ED Provider Notes (Signed)
[place] EMERGENCY DEPARTMENT Provider Note   CSN: [id number] Arrival date & time: 02/14/19  1742    History   Chief Complaint Chief Complaint  Patient presents with  . Shortness of Breath    HPI Kristi Shelton is a 28 y.o. female.     The history is provided by the patient.  Shortness of Breath Severity:  Mild Onset quality:  Gradual Duration:  2 days Timing:  Intermittent Progression:  Waxing and waning Chronicity:  New Context comment:  Patient feels dehyrdated and SOB after weekend of drinking.  Relieved by:  Nothing Worsened by:  Nothing Associated symptoms: no abdominal pain, no chest pain, no cough, no ear pain, no fever, no rash, no sore throat and no vomiting   Risk factors: alcohol use   Risk factors: no hx of PE/DVT     Past Medical History:  Diagnosis Date  . Anemia   . Tonsil stone     There are no active problems to display for this patient.   Past Surgical History:  Procedure Laterality Date  . CESAREAN SECTION    . TOOTH EXTRACTION    . WISDOM TOOTH EXTRACTION       OB History    Gravida  7   Para  6   Term  5   Preterm  1   AB  1   Living        SAB      TAB      Ectopic  1   Multiple      Live Births               Home Medications    Prior to Admission medications   Medication Sig Start Date End Date Taking? Authorizing Provider  ibuprofen (ADVIL,MOTRIN) 800 MG tablet Take 1 tablet (800 mg total) by mouth every 8 (eight) hours as needed. 08/23/18   Lawyer, Harrell Gave, PA-C  traMADol (ULTRAM) 50 MG tablet Take 1 tablet (50 mg total) by mouth every 6 (six) hours as needed for severe pain. 08/23/18   Lawyer, Harrell Gave, PA-C    Family History No family history on file.  Social History Social History   Tobacco Use  . Smoking status: Never Smoker  . Smokeless tobacco: Never Used  Substance Use Topics  . Alcohol use: Yes    Comment: daily  . Drug use: No     Allergies   Amoxil  [amoxicillin] and Penicillins   Review of Systems Review of Systems  Constitutional: Negative for chills and fever.  HENT: Negative for ear pain and sore throat.   Eyes: Negative for pain and visual disturbance.  Respiratory: Positive for shortness of breath. Negative for cough.   Cardiovascular: Negative for chest pain and palpitations.  Gastrointestinal: Negative for abdominal pain and vomiting.  Genitourinary: Negative for dysuria and hematuria.  Musculoskeletal: Negative for arthralgias and back pain.  Skin: Negative for color change and rash.  Neurological: Negative for seizures and syncope.  All other systems reviewed and are negative.    Physical Exam Updated Vital Signs  ED Triage Vitals  Enc Vitals Group     BP 02/14/19 1752 115/77     Pulse Rate 02/14/19 1752 (!) 112     Resp 02/14/19 1752 18     Temp 02/14/19 1752 98.4 F (36.9 C)     Temp Source 02/14/19 1752 Oral     SpO2 02/14/19 1752 100 %     Weight 02/14/19 1752 139 lb (63  kg)     Height 02/14/19 1752 5\' 2"  (1.575 m)     Head Circumference --      Peak Flow --      Pain Score 02/14/19 1749 0     Pain Loc --      Pain Edu? --      Excl. in GC? --     Physical Exam Vitals signs and nursing note reviewed.  Constitutional:      General: She is not in acute distress.    Appearance: She is well-developed.  HENT:     Head: Normocephalic and atraumatic.  Eyes:     Conjunctiva/sclera: Conjunctivae normal.  Neck:     Musculoskeletal: Neck supple.  Cardiovascular:     Rate and Rhythm: Regular rhythm. Tachycardia present.     Pulses: Normal pulses.     Heart sounds: Normal heart sounds. No murmur.  Pulmonary:     Effort: Pulmonary effort is normal. No respiratory distress.     Breath sounds: Normal breath sounds. No decreased breath sounds, wheezing, rhonchi or rales.  Abdominal:     Palpations: Abdomen is soft.     Tenderness: There is no abdominal tenderness.  Musculoskeletal:     Right lower leg:  No edema.     Left lower leg: No edema.  Skin:    General: Skin is warm and dry.     Capillary Refill: Capillary refill takes less than 2 seconds.  Neurological:     General: No focal deficit present.     Mental Status: She is alert.  Psychiatric:        Mood and Affect: Mood normal.      ED Treatments / Results  Labs (all labs ordered are listed, but only abnormal results are displayed) Labs Reviewed  CBC WITH DIFFERENTIAL/PLATELET - Abnormal; Notable for the following components:      Result Value   Hemoglobin 8.5 (*)    HCT 29.3 (*)    MCV 72.7 (*)    MCH 21.1 (*)    MCHC 29.0 (*)    RDW 18.1 (*)    Platelets 413 (*)    All other components within normal limits  BASIC METABOLIC PANEL - Abnormal; Notable for the following components:   Potassium 3.3 (*)    Glucose, Bld 122 (*)    All other components within normal limits  D-DIMER, QUANTITATIVE (NOT AT [facility])  PREGNANCY, URINE    EKG EKG Interpretation  Date/Time:  Wednesday February 14 2019 18:19:09 EDT Ventricular Rate:  106 PR Interval:    QRS Duration: 81 QT Interval:  327 QTC Calculation: 435 R Axis:   58 Text Interpretation:  Sinus tachycardia Low voltage, precordial leads Confirmed by Virgina NorfolkAdam, Dellar Traber [id number]) on 02/14/2019 6:22:41 PM   Radiology Dg Chest 2 View  Result Date: 02/14/2019 CLINICAL DATA:  Shortness of breath for 2-3 days EXAM: CHEST - 2 VIEW COMPARISON:  Radiograph 08/09/2018 FINDINGS: Monitoring leads overlie the chest. The heart size and mediastinal contours are within normal limits. Both lungs are clear. The visualized skeletal structures are unremarkable. IMPRESSION: No active cardiopulmonary disease. Electronically Signed   By: MD Kreg ShropshirePrice  DeHay   On: 02/14/2019 18:55    Procedures Procedures (including critical care time)  Medications Ordered in ED Medications  sodium chloride 0.9 % bolus 1,000 mL (1,000 mLs Intravenous New Bag/Given 02/14/19 1824)     Initial Impression / Assessment and  Plan / ED Course  I have reviewed the triage vital signs and  the nursing notes.  Pertinent labs & imaging results that were available during my care of the patient were reviewed by me and considered in my medical decision making (see chart for details).     Kristi Shelton is a 28 year old female who presents to the ED with dehydration, shortness of breath.  Patient mildly tachycardic upon arrival.  Patient states that she was binge drinking alcohol over the last several days with friends.  She denies any fever, chills.  Feels dehydrated.  Has some shortness of breath.  Patient with negative d-dimer.  Doubt PE.  Patient has no chest pain.  EKG shows sinus tachycardia at 101.  Patient with no significant anemia, electrolyte abnormality, kidney injury.  Chest x-ray showed no signs of pneumonia, pneumothorax, pleural effusion.  Overall lab work unremarkable.  Patient likely mildly dehydrated.  Given IV fluids.  Symptoms improved.  Recommend continued hydration at home.  No concern for acute pulmonary or cardiac process.  Pregnancy test negative.  Patient given reassurance and discharged in ED in good condition.  This chart was dictated using voice recognition software.  Despite best efforts to proofread,  errors can occur which can change the documentation meaning.    Final Clinical Impressions(s) / ED Diagnoses   Final diagnoses:  Dehydration    ED Discharge Orders    None       Virgina NorfolkCuratolo, Jazen Spraggins, DO 02/14/19 1900

## 2019-04-22 ENCOUNTER — Emergency Department (HOSPITAL_BASED_OUTPATIENT_CLINIC_OR_DEPARTMENT_OTHER)
Admission: EM | Admit: 2019-04-22 | Discharge: 2019-04-23 | Disposition: A | Discharge status: Home / Self Care | Payer: Medicaid Other | Attending: Emergency Medicine | Admitting: Emergency Medicine

## 2019-04-22 ENCOUNTER — Encounter (HOSPITAL_BASED_OUTPATIENT_CLINIC_OR_DEPARTMENT_OTHER): Payer: Self-pay | Admitting: *Deleted

## 2019-04-22 ENCOUNTER — Other Ambulatory Visit: Payer: Self-pay

## 2019-04-22 DIAGNOSIS — N898 Other specified noninflammatory disorders of vagina: Secondary | ICD-10-CM

## 2019-04-22 DIAGNOSIS — A599 Trichomoniasis, unspecified: Secondary | ICD-10-CM | POA: Insufficient documentation

## 2019-04-22 LAB — WET PREP, GENITAL
Clue Cells Wet Prep HPF POC: NONE SEEN
Sperm: NONE SEEN
Yeast Wet Prep HPF POC: NONE SEEN

## 2019-04-22 LAB — PREGNANCY, URINE: Preg Test, Ur: NEGATIVE

## 2019-04-22 LAB — URINALYSIS, ROUTINE W REFLEX MICROSCOPIC
Glucose, UA: NEGATIVE mg/dL
Hgb urine dipstick: NEGATIVE
Ketones, ur: 15 mg/dL — AB
Leukocytes,Ua: NEGATIVE
Nitrite: NEGATIVE
Protein, ur: NEGATIVE mg/dL
Specific Gravity, Urine: >1.03 — ABNORMAL HIGH (ref 1.005–1.030)
pH: 6 (ref 5.0–8.0)

## 2019-04-22 MED ORDER — METRONIDAZOLE 500 MG PO TABS
2000.0000 mg | ORAL_TABLET | Freq: Once | ORAL | Status: AC
  Administered 2019-04-22: 2000 mg via ORAL
  Filled 2019-04-22: qty 4

## 2019-04-22 MED ORDER — CEFTRIAXONE SODIUM 250 MG IJ SOLR
250.0000 mg | Freq: Once | INTRAMUSCULAR | Status: AC
  Administered 2019-04-22: 250 mg via INTRAMUSCULAR
  Filled 2019-04-22: qty 250

## 2019-04-22 MED ORDER — AZITHROMYCIN 1 G PO PACK
1.0000 g | PACK | Freq: Once | ORAL | Status: AC
  Administered 2019-04-22: 1 g via ORAL
  Filled 2019-04-22: qty 1

## 2019-04-22 NOTE — ED Triage Notes (Signed)
Pt reports vaginal d/c x 1 month with odor

## 2019-04-22 NOTE — Discharge Instructions (Addendum)
You were seen today with concerns for STDs.  You were positive for trichomonas.  You were also tested and treated for gonorrhea and chlamydia.  Abstain from sexual activity for the next 10 days.  Always use condoms.

## 2019-04-22 NOTE — ED Notes (Signed)
Pelvic cart at room 

## 2019-04-23 MED ORDER — ONDANSETRON 4 MG PO TBDP: 4.0000 mg | ORAL_TABLET | Freq: Once | ORAL | Status: DC

## 2019-04-23 MED ORDER — METRONIDAZOLE 500 MG PO TABS: 2000.0000 mg | ORAL_TABLET | Freq: Once | ORAL | 0 refills | Status: AC

## 2019-04-23 NOTE — ED Notes (Signed)
Pt vomited the metronidazole while trying to take it. Pt took the azithromycin after vomiting. Notified Dr. Dina Rich. Prescription written so pt can get it from her pharmacy that can compound it into a form pt can tolerate. Stressed importance of taking abx. Pt agreeable to plan.

## 2019-04-23 NOTE — ED Provider Notes (Signed)
[facility] EMERGENCY DEPARTMENT Provider Note   CSN: [id number] Arrival date & time: 04/22/19  2220     History   Chief Complaint Chief Complaint  Patient presents with  . Vaginal Discharge    HPI Kristi Shelton is a 28 y.o. female.     HPI  This is a 28 year old female who presents with vaginal discharge.  Patient reports 1 month history of vaginal discharge.  She is concerned for STDs.  She reports that her boyfriend "messed around all me."  They do not use condoms.  She is not having any pelvic pain.  Denies any dysuria hematuria.  Has noted a foul odor.  Past Medical History:  Diagnosis Date  . Anemia   . Tonsil stone     There are no active problems to display for this patient.   Past Surgical History:  Procedure Laterality Date  . CESAREAN SECTION    . TOOTH EXTRACTION    . WISDOM TOOTH EXTRACTION       OB History    Gravida  7   Para  6   Term  5   Preterm  1   AB  1   Living        SAB      TAB      Ectopic  1   Multiple      Live Births               Home Medications    Prior to Admission medications   Medication Sig Start Date End Date Taking? Authorizing Provider  ibuprofen (ADVIL,MOTRIN) 800 MG tablet Take 1 tablet (800 mg total) by mouth every 8 (eight) hours as needed. 08/23/18   Lawyer, Cristal Deerhristopher, PA-C  traMADol (ULTRAM) 50 MG tablet Take 1 tablet (50 mg total) by mouth every 6 (six) hours as needed for severe pain. 08/23/18   Lawyer, Cristal Deerhristopher, PA-C    Family History No family history on file.  Social History Social History   Tobacco Use  . Smoking status: Never Smoker  . Smokeless tobacco: Never Used  Substance Use Topics  . Alcohol use: Yes    Comment: daily  . Drug use: No     Allergies   Amoxil [amoxicillin] and Penicillins   Review of Systems Review of Systems  Constitutional: Negative for fever.  Gastrointestinal: Negative for abdominal pain.  Genitourinary: Positive for vaginal  discharge. Negative for dysuria, hematuria and pelvic pain.  All other systems reviewed and are negative.    Physical Exam Updated Vital Signs BP 114/85 (BP Location: Right Arm)   Pulse 90   Temp 98.8 F (37.1 C) (Oral)   Resp 18   Ht 1.6 m (5\' 3" )   Wt 64 kg   LMP 04/03/2019   SpO2 100%   BMI 24.98 kg/m   Physical Exam Vitals signs and nursing note reviewed.  Constitutional:      Appearance: She is well-developed. She is not ill-appearing.  HENT:     Head: Normocephalic and atraumatic.  Cardiovascular:     Rate and Rhythm: Normal rate and regular rhythm.  Pulmonary:     Effort: Pulmonary effort is normal. No respiratory distress.  Abdominal:     Palpations: Abdomen is soft.     Tenderness: There is no abdominal tenderness.  Genitourinary:    Comments: External vaginal exam within normal limits, moderate white vaginal discharge noted, no significant cervical friability noted Skin:    General: Skin is warm and dry.  Neurological:     Mental Status: She is alert and oriented to person, place, and time.  Psychiatric:        Mood and Affect: Mood normal.      ED Treatments / Results  Labs (all labs ordered are listed, but only abnormal results are displayed) Labs Reviewed  WET PREP, GENITAL - Abnormal; Notable for the following components:      Result Value   Trich, Wet Prep PRESENT (*)    WBC, Wet Prep HPF POC MANY (*)    All other components within normal limits  URINALYSIS, ROUTINE W REFLEX MICROSCOPIC - Abnormal; Notable for the following components:   Specific Gravity, Urine >1.030 (*)    Bilirubin Urine SMALL (*)    Ketones, ur 15 (*)    All other components within normal limits  PREGNANCY, URINE  GC/CHLAMYDIA PROBE AMP ([place]) NOT AT [facility]    EKG None  Radiology No results found.  Procedures Procedures (including critical care time)  Medications Ordered in ED Medications  cefTRIAXone (ROCEPHIN) injection 250 mg (250 mg Intramuscular  Given 04/22/19 2354)  azithromycin (ZITHROMAX) powder 1 g (1 g Oral Given 04/22/19 2354)  metroNIDAZOLE (FLAGYL) tablet 2,000 mg (2,000 mg Oral Given 04/22/19 2354)     Initial Impression / Assessment and Plan / ED Course  I have reviewed the triage vital signs and the nursing notes.  Pertinent labs & imaging results that were available during my care of the patient were reviewed by me and considered in my medical decision making (see chart for details).        Patient presents with concerns for STD.  She is nontoxic on exam.  Vital signs are reassuring.  No abdominal or pelvic pain.  Moderate vaginal discharge on exam.  Patient was tested and treated for gonorrhea and chlamydia.  Noted to have trichomonas on her wet prep.  She was also treated for this.  She was counseled regarding safe sex practices.  Abstain from sexual activity for the next 10 days and always use condoms.  After history, exam, and medical workup I feel the patient has been appropriately medically screened and is safe for discharge home. Pertinent diagnoses were discussed with the patient. Patient was given return precautions.   Final Clinical Impressions(s) / ED Diagnoses   Final diagnoses:  Trichimoniasis  Vaginal discharge    ED Discharge Orders    None       Horton, Barbette Hair, MD 04/23/19 0004

## 2019-04-24 LAB — GC/CHLAMYDIA PROBE AMP (~~LOC~~) NOT AT ARMC
Chlamydia: NEGATIVE
Neisseria Gonorrhea: NEGATIVE

## 2020-05-09 ENCOUNTER — Encounter (HOSPITAL_BASED_OUTPATIENT_CLINIC_OR_DEPARTMENT_OTHER): Payer: Self-pay

## 2020-05-09 ENCOUNTER — Emergency Department (HOSPITAL_BASED_OUTPATIENT_CLINIC_OR_DEPARTMENT_OTHER)
Admission: EM | Admit: 2020-05-09 | Discharge: 2020-05-09 | Disposition: A | Discharge status: Home / Self Care | Payer: Medicaid Other | Attending: Emergency Medicine | Admitting: Emergency Medicine

## 2020-05-09 ENCOUNTER — Other Ambulatory Visit: Payer: Self-pay

## 2020-05-09 DIAGNOSIS — Z202 Contact with and (suspected) exposure to infections with a predominantly sexual mode of transmission: Secondary | ICD-10-CM | POA: Insufficient documentation

## 2020-05-09 DIAGNOSIS — R0981 Nasal congestion: Secondary | ICD-10-CM | POA: Diagnosis not present

## 2020-05-09 DIAGNOSIS — Z711 Person with feared health complaint in whom no diagnosis is made: Secondary | ICD-10-CM | POA: Diagnosis not present

## 2020-05-09 DIAGNOSIS — R0602 Shortness of breath: Secondary | ICD-10-CM | POA: Diagnosis present

## 2020-05-09 LAB — BASIC METABOLIC PANEL
Anion gap: 9 (ref 5–15)
BUN: 8 mg/dL (ref 6–20)
CO2: 24 mmol/L (ref 22–32)
Calcium: 8.9 mg/dL (ref 8.9–10.3)
Chloride: 104 mmol/L (ref 98–111)
Creatinine, Ser: 0.6 mg/dL (ref 0.44–1.00)
GFR calc Af Amer: >60 mL/min (ref >60)
GFR calc non Af Amer: >60 mL/min (ref >60)
Glucose, Bld: 101 mg/dL — ABNORMAL HIGH (ref 70–99)
Potassium: 3.5 mmol/L (ref 3.5–5.1)
Sodium: 137 mmol/L (ref 135–145)

## 2020-05-09 LAB — WET PREP, GENITAL
Clue Cells Wet Prep HPF POC: NONE SEEN
Sperm: NONE SEEN
Trich, Wet Prep: NONE SEEN
Yeast Wet Prep HPF POC: NONE SEEN

## 2020-05-09 LAB — MAGNESIUM: Magnesium: 1.9 mg/dL (ref 1.7–2.4)

## 2020-05-09 MED ORDER — OXYMETAZOLINE HCL 0.05 % NA SOLN
2.0000 | Freq: Two times a day (BID) | NASAL | Status: DC | PRN
  Administered 2020-05-09: 2 via NASAL
  Filled 2020-05-09: qty 30

## 2020-05-09 NOTE — ED Triage Notes (Addendum)
Pt c/o muscle cramps, ShOB, nasal congestion, being "gasy" x 2 days. Pt states she thinks she needs some magnesium. Pt requesting to be checked for STDs.

## 2020-05-09 NOTE — ED Provider Notes (Signed)
[facility] Provider Note: Lowella Dell, MD, FACEP  CSN: [id number] MRN: [id number] ARRIVAL: 05/09/20 at 0007 ROOM: [facility]   CHIEF COMPLAINT  Shortness of Breath   HISTORY OF PRESENT ILLNESS  05/09/20 2:00 AM Kristi Shelton is a 29 y.o. female for the past several days has had difficulty sleeping because she awakened suddenly from sleep with a sensation that she cannot breathe through her nose.  She states this is happened before and was due to low magnesium.  She denies any muscle aches or weakness presently.  She has been taking magnesium supplements at home without adequate relief.  She also would like to be tested for STDs although she is asymptomatic.   Past Medical History:  Diagnosis Date   Anemia    Tonsil stone     Past Surgical History:  Procedure Laterality Date   CESAREAN SECTION     TOOTH EXTRACTION     WISDOM TOOTH EXTRACTION      No family history on file.  Social History   Tobacco Use   Smoking status: Never Smoker   Smokeless tobacco: Never Used  Vaping Use   Vaping Use: Never used  Substance Use Topics   Alcohol use: Yes    Comment: daily   Drug use: No    Prior to Admission medications   Medication Sig Start Date End Date Taking? Authorizing Provider  magnesium 30 MG tablet Take 30 mg by mouth 2 (two) times daily.   Yes [provider]  Multiple Vitamin (MULTIVITAMIN) tablet Take 1 tablet by mouth daily.   Yes [provider]    Allergies Amoxil [amoxicillin] and Penicillins   REVIEW OF SYSTEMS  Negative except as noted here or in the History of Present Illness.   PHYSICAL EXAMINATION  Initial Vital Signs Blood pressure 105/75, pulse 77, temperature 97.8 F (36.6 C), temperature source Oral, resp. rate 16, height 5\' 2"  (1.575 m), weight 65.8 kg, last menstrual period 05/07/2020, SpO2 96 %.  Examination General: Well-developed, well-nourished female in no acute distress; appearance  consistent with age of record HENT: normocephalic; atraumatic; no pharyngeal erythema or exudate; no tonsillar enlargement; no dysphonia Eyes: pupils equal, round and reactive to light; extraocular muscles intact Neck: supple Heart: regular rate and rhythm Lungs: clear to auscultation bilaterally Abdomen: soft; nondistended; nontender; bowel sounds present Extremities: No deformity; full range of motion; pulses normal; no muscle tenderness Neurologic: Awake, alert and oriented; motor function intact in all extremities and symmetric; no facial droop Skin: Warm and dry Psychiatric: Normal mood and affect   RESULTS  Summary of this visit's results, reviewed and interpreted by myself:   EKG Interpretation  Date/Time:    Ventricular Rate:    PR Interval:    QRS Duration:   QT Interval:    QTC Calculation:   R Axis:     Text Interpretation:        Laboratory Studies: Results for orders placed or performed during the hospital encounter of 05/09/20 (from the past 24 hour(s))  Wet prep, genital     Status: Abnormal   Collection Time: 05/09/20  2:13 AM   Specimen: Vaginal; Genital  Result Value Ref Range   Yeast Wet Prep HPF POC NONE SEEN NONE SEEN   Trich, Wet Prep NONE SEEN NONE SEEN   Clue Cells Wet Prep HPF POC NONE SEEN NONE SEEN   WBC, Wet Prep HPF POC MODERATE (A) NONE SEEN   Sperm NONE SEEN   Magnesium  Status: None   Collection Time: 05/09/20  2:55 AM  Result Value Ref Range   Magnesium 1.9 1.7 - 2.4 mg/dL  Basic metabolic panel     Status: Abnormal   Collection Time: 05/09/20  2:55 AM  Result Value Ref Range   Sodium 137 135 - 145 mmol/L   Potassium 3.5 3.5 - 5.1 mmol/L   Chloride 104 98 - 111 mmol/L   CO2 24 22 - 32 mmol/L   Glucose, Bld 101 (H) 70 - 99 mg/dL   BUN 8 6 - 20 mg/dL   Creatinine, Ser 1.61 0.44 - 1.00 mg/dL   Calcium 8.9 8.9 - 09.6 mg/dL   GFR calc non Af Amer >60 >60 mL/min   GFR calc Af Amer >60 >60 mL/min   Anion gap 9 5 - 15   Imaging  Studies: No results found.  ED COURSE and MDM  Nursing notes, initial and subsequent vitals signs, including pulse oximetry, reviewed and interpreted by myself.  Vitals:   05/09/20 0012 05/09/20 0014 05/09/20 0222  BP: 105/75  103/65  Pulse: 77  77  Resp: 16  14  Temp: 97.8 F (36.6 C)  98.1 F (36.7 C)  TempSrc: Oral  Oral  SpO2: 96%  100%  Weight:  65.8 kg   Height:  5\' 2"  (1.575 m)    Medications  oxymetazoline (AFRIN) 0.05 % nasal spray 2 spray (has no administration in time range)   The patient's magnesium and other electrolytes are within normal limits.  GC and Chlamydia tests pending.  We will provide Afrin for nasal congestion.  She is thin of build with no tonsillar enlargement so I see no anatomical risk factors for sleep apnea.   PROCEDURES  Procedures   ED DIAGNOSES     ICD-10-CM   1. Nasal congestion  R09.81   2. Concern about metabolic disease without diagnosis  Z71.1   3. Concern about STD in female without diagnosis  Z71.1        Keiley Levey, , MD 05/09/20 [phone]

## 2020-05-12 LAB — GC/CHLAMYDIA PROBE AMP (~~LOC~~) NOT AT ARMC
Chlamydia: NEGATIVE
Comment: NEGATIVE
Comment: NORMAL
Neisseria Gonorrhea: NEGATIVE

## 2020-08-21 ENCOUNTER — Encounter (HOSPITAL_BASED_OUTPATIENT_CLINIC_OR_DEPARTMENT_OTHER): Payer: Self-pay | Admitting: *Deleted

## 2020-08-21 ENCOUNTER — Other Ambulatory Visit: Payer: Self-pay

## 2020-08-21 DIAGNOSIS — R070 Pain in throat: Secondary | ICD-10-CM | POA: Diagnosis present

## 2020-08-21 DIAGNOSIS — R202 Paresthesia of skin: Secondary | ICD-10-CM | POA: Insufficient documentation

## 2020-08-21 DIAGNOSIS — E860 Dehydration: Secondary | ICD-10-CM | POA: Insufficient documentation

## 2020-08-21 DIAGNOSIS — Z20822 Contact with and (suspected) exposure to covid-19: Secondary | ICD-10-CM | POA: Insufficient documentation

## 2020-08-21 NOTE — ED Triage Notes (Signed)
C/o sinus pressure and congestion x 1 day , no relief with OTC meds, bil leg burning and numbness with back pain x 3 days

## 2020-08-22 ENCOUNTER — Emergency Department (HOSPITAL_BASED_OUTPATIENT_CLINIC_OR_DEPARTMENT_OTHER)
Admission: EM | Admit: 2020-08-22 | Discharge: 2020-08-22 | Disposition: A | Discharge status: Home / Self Care | Payer: Medicaid Other | Attending: Emergency Medicine | Admitting: Emergency Medicine

## 2020-08-22 DIAGNOSIS — E860 Dehydration: Secondary | ICD-10-CM

## 2020-08-22 DIAGNOSIS — Z20822 Contact with and (suspected) exposure to covid-19: Secondary | ICD-10-CM

## 2020-08-22 LAB — URINALYSIS, MICROSCOPIC (REFLEX)
Bacteria, UA: NONE SEEN
RBC / HPF: >50 RBC/hpf (ref 0–5)

## 2020-08-22 LAB — SARS CORONAVIRUS 2 (TAT 6-24 HRS): SARS Coronavirus 2: NEGATIVE

## 2020-08-22 LAB — URINALYSIS, ROUTINE W REFLEX MICROSCOPIC
Bilirubin Urine: NEGATIVE
Glucose, UA: NEGATIVE mg/dL
Ketones, ur: 80 mg/dL — AB
Leukocytes,Ua: NEGATIVE
Nitrite: NEGATIVE
Protein, ur: 30 mg/dL — AB
Specific Gravity, Urine: 1.03 (ref 1.005–1.030)
pH: 6 (ref 5.0–8.0)

## 2020-08-22 LAB — GROUP A STREP BY PCR: Group A Strep by PCR: NOT DETECTED

## 2020-08-22 LAB — PREGNANCY, URINE: Preg Test, Ur: NEGATIVE

## 2020-08-22 MED ORDER — LANSOPRAZOLE 30 MG PO CPDR: 30.0000 mg | DELAYED_RELEASE_CAPSULE | Freq: Every day | ORAL | 0 refills | Status: DC

## 2020-08-22 MED ORDER — PANTOPRAZOLE SODIUM 40 MG PO TBEC
40.0000 mg | DELAYED_RELEASE_TABLET | Freq: Once | ORAL | Status: AC
  Administered 2020-08-22: 40 mg via ORAL
  Filled 2020-08-22: qty 1

## 2020-08-22 MED ORDER — NAPROXEN 250 MG PO TABS
500.0000 mg | ORAL_TABLET | Freq: Once | ORAL | Status: AC
  Administered 2020-08-22: 500 mg via ORAL
  Filled 2020-08-22: qty 2

## 2020-08-22 MED ORDER — NAPROXEN 375 MG PO TABS: ORAL_TABLET | ORAL | 0 refills | Status: DC

## 2020-08-22 NOTE — ED Provider Notes (Signed)
[facility] Provider Note: Lowella Dell, MD, FACEP  CSN: [id number] MRN: [id number] ARRIVAL: 08/21/20 at 2225 ROOM: [facility]07/[facility]07   CHIEF COMPLAINT  multiple complaints   HISTORY OF PRESENT ILLNESS  08/22/20 1:57 AM Kristi Shelton is a 30 y.o. female with a 4-day history of pain in the muscles of her left mid back.  This pain radiates around to her left breast.  There is no associated rash.  Pain is worse with movement or palpation.  She has also had a sore throat on the right side for the past day.  This is worse with swallowing.  She has also had bilateral leg burning and intermittent paresthesias for the past 3 days.  To me she denies nasal congestion or cough but states she has had some mild shortness of breath.  She has taken ibuprofen without relief of her symptoms.  She has not had a fever.  She also states she has had acid reflux recently and believes that this may be the cause of her back pain and her leg burning.  She has not taken anything for her acid reflux.  Symptoms are moderate.   Past Medical History:  Diagnosis Date  . Anemia   . Tonsil stone     Past Surgical History:  Procedure Laterality Date  . CESAREAN SECTION    . TOOTH EXTRACTION    . TUBAL LIGATION    . WISDOM TOOTH EXTRACTION      No family history on file.  Social History   Tobacco Use  . Smoking status: Never Smoker  . Smokeless tobacco: Never Used  Vaping Use  . Vaping Use: Never used  Substance Use Topics  . Alcohol use: Yes    Comment: daily  . Drug use: No    Prior to Admission medications   Medication Sig Start Date End Date Taking? Authorizing Provider  lansoprazole (PREVACID) 30 MG capsule Take 1 capsule (30 mg total) by mouth daily at 12 noon. 08/22/20  Yes Kinisha Soper, MD  naproxen (NAPROSYN) 375 MG tablet Take 1 tablet twice daily as needed for pain. 08/22/20  Yes Celines Femia, MD  magnesium 30 MG tablet Take 30 mg by mouth 2 (two) times daily.    [provider]  Multiple Vitamin (MULTIVITAMIN) tablet Take 1 tablet by mouth daily.    [provider]    Allergies Amoxil [amoxicillin] and Penicillins   REVIEW OF SYSTEMS  Negative except as noted here or in the History of Present Illness.   PHYSICAL EXAMINATION  Initial Vital Signs Blood pressure 113/75, pulse 74, temperature 98 F (36.7 C), temperature source Oral, resp. rate 17, height 5\' 3"  (1.6 m), weight 61.2 kg, last menstrual period 08/19/2020, SpO2 100 %.  Examination General: Well-developed, well-nourished female in no acute distress; appearance consistent with age of record HENT: normocephalic; atraumatic; no pharyngeal erythema or exudate Eyes: pupils equal, round and reactive to light; extraocular muscles intact Neck: supple; tender right anterior cervical lymph node without enlargement Heart: regular rate and rhythm Lungs: clear to auscultation bilaterally Abdomen: soft; nondistended; nontender; bowel sounds present Back: Mild tenderness of thoracic paraspinal muscles Extremities: No deformity; full range of motion; pulses normal Neurologic: Awake, alert and oriented; motor function intact in all extremities and symmetric; no facial droop Skin: Warm and dry Psychiatric: Normal mood and affect   RESULTS  Summary of this visit's results, reviewed and interpreted by myself:   EKG Interpretation  Date/Time:    Ventricular Rate:  PR Interval:    QRS Duration:   QT Interval:    QTC Calculation:   R Axis:     Text Interpretation:        Laboratory Studies: Results for orders placed or performed during the hospital encounter of 08/22/20 (from the past 24 hour(s))  Urinalysis, Routine w reflex microscopic Urine, Clean Catch     Status: Abnormal   Collection Time: 08/22/20  1:16 AM  Result Value Ref Range   Color, Urine YELLOW YELLOW   APPearance HAZY (A) CLEAR   Specific Gravity, Urine 1.030 1.005 - 1.030   pH 6.0 5.0 - 8.0   Glucose, UA  NEGATIVE NEGATIVE mg/dL   Hgb urine dipstick LARGE (A) NEGATIVE   Bilirubin Urine NEGATIVE NEGATIVE   Ketones, ur 80 (A) NEGATIVE mg/dL   Protein, ur 30 (A) NEGATIVE mg/dL   Nitrite NEGATIVE NEGATIVE   Leukocytes,Ua NEGATIVE NEGATIVE  Pregnancy, urine     Status: None   Collection Time: 08/22/20  1:16 AM  Result Value Ref Range   Preg Test, Ur NEGATIVE NEGATIVE  Urinalysis, Microscopic (reflex)     Status: None   Collection Time: 08/22/20  1:16 AM  Result Value Ref Range   RBC / HPF >50 0 - 5 RBC/hpf   WBC, UA 0-5 0 - 5 WBC/hpf   Bacteria, UA NONE SEEN NONE SEEN   Squamous Epithelial / LPF 0-5 0 - 5   Mucus PRESENT   Group A Strep by PCR     Status: None   Collection Time: 08/22/20  2:08 AM   Specimen: Throat; Sterile Swab  Result Value Ref Range   Group A Strep by PCR NOT DETECTED NOT DETECTED   Imaging Studies: No results found.  ED COURSE and MDM  Nursing notes, initial and subsequent vitals signs, including pulse oximetry, reviewed and interpreted by myself.  Vitals:   08/21/20 2231 08/21/20 2232 08/22/20 0130  BP: 115/72  113/75  Pulse: 94  74  Resp: 16  17  Temp: 98 F (36.7 C)    TempSrc: Oral    SpO2: 99%  100%  Weight:  61.2 kg   Height:  5\' 3"  (1.6 m)    Medications  pantoprazole (PROTONIX) EC tablet 40 mg (40 mg Oral Given 08/22/20 0223)  naproxen (NAPROSYN) tablet 500 mg (500 mg Oral Given 08/22/20 0222)   3:25 AM Patient advised her urine shows evidence of dehydration and she should increase fluid intake.  No evidence of strep on PCR test. COVID test pending.  Will treat for possible GERD although GERD would not be expected to cause her diffuse somatic symptoms.   PROCEDURES  Procedures   ED DIAGNOSES     ICD-10-CM   1. Suspected COVID-19 virus infection  Z20.822   2. Dehydration  E86.0        Leasia Swann, 08/24/20, MD 08/22/20 [phone]

## 2021-05-07 ENCOUNTER — Other Ambulatory Visit: Payer: Self-pay

## 2021-05-07 ENCOUNTER — Encounter (HOSPITAL_BASED_OUTPATIENT_CLINIC_OR_DEPARTMENT_OTHER): Payer: Self-pay | Admitting: Emergency Medicine

## 2021-05-07 ENCOUNTER — Emergency Department (HOSPITAL_BASED_OUTPATIENT_CLINIC_OR_DEPARTMENT_OTHER)
Admission: EM | Admit: 2021-05-07 | Discharge: 2021-05-07 | Disposition: A | Discharge status: Home / Self Care | Payer: Medicaid Other | Attending: Emergency Medicine | Admitting: Emergency Medicine

## 2021-05-07 ENCOUNTER — Emergency Department (HOSPITAL_BASED_OUTPATIENT_CLINIC_OR_DEPARTMENT_OTHER): Payer: Medicaid Other

## 2021-05-07 DIAGNOSIS — R0602 Shortness of breath: Secondary | ICD-10-CM | POA: Diagnosis present

## 2021-05-07 DIAGNOSIS — R0982 Postnasal drip: Secondary | ICD-10-CM | POA: Diagnosis not present

## 2021-05-07 LAB — CBC WITH DIFFERENTIAL/PLATELET
Abs Immature Granulocytes: 0.02 10*3/uL (ref 0.00–0.07)
Basophils Absolute: 0.1 10*3/uL (ref 0.0–0.1)
Basophils Relative: 1 %
Eosinophils Absolute: 0.2 10*3/uL (ref 0.0–0.5)
Eosinophils Relative: 2 %
HCT: 27 % — ABNORMAL LOW (ref 36.0–46.0)
Hemoglobin: 7.9 g/dL — ABNORMAL LOW (ref 12.0–15.0)
Immature Granulocytes: 0 %
Lymphocytes Relative: 28 %
Lymphs Abs: 2.5 10*3/uL (ref 0.7–4.0)
MCH: 20.4 pg — ABNORMAL LOW (ref 26.0–34.0)
MCHC: 29.3 g/dL — ABNORMAL LOW (ref 30.0–36.0)
MCV: 69.6 fL — ABNORMAL LOW (ref 80.0–100.0)
Monocytes Absolute: 0.6 10*3/uL (ref 0.1–1.0)
Monocytes Relative: 7 %
Neutro Abs: 5.5 10*3/uL (ref 1.7–7.7)
Neutrophils Relative %: 62 %
Platelets: 366 10*3/uL (ref 150–400)
RBC: 3.88 MIL/uL (ref 3.87–5.11)
RDW: 17.7 % — ABNORMAL HIGH (ref 11.5–15.5)
WBC: 8.9 10*3/uL (ref 4.0–10.5)
nRBC: 0 % (ref 0.0–0.2)

## 2021-05-07 LAB — BASIC METABOLIC PANEL
Anion gap: 6 (ref 5–15)
BUN: 12 mg/dL (ref 6–20)
CO2: 24 mmol/L (ref 22–32)
Calcium: 8.8 mg/dL — ABNORMAL LOW (ref 8.9–10.3)
Chloride: 107 mmol/L (ref 98–111)
Creatinine, Ser: 0.85 mg/dL (ref 0.44–1.00)
GFR, Estimated: >60 mL/min (ref >60)
Glucose, Bld: 101 mg/dL — ABNORMAL HIGH (ref 70–99)
Potassium: 3.2 mmol/L — ABNORMAL LOW (ref 3.5–5.1)
Sodium: 137 mmol/L (ref 135–145)

## 2021-05-07 MED ORDER — FERROUS SULFATE 325 (65 FE) MG PO TABS: 325.0000 mg | ORAL_TABLET | Freq: Every day | ORAL | 0 refills | Status: DC

## 2021-05-07 MED ORDER — POTASSIUM CHLORIDE CRYS ER 20 MEQ PO TBCR
40.0000 meq | EXTENDED_RELEASE_TABLET | Freq: Once | ORAL | Status: AC
  Administered 2021-05-07: 40 meq via ORAL
  Filled 2021-05-07: qty 2

## 2021-05-07 NOTE — ED Triage Notes (Signed)
Pt is c/o shortness of breath x 1 day  Pt states yesterday she felt weak like she was going to pass out   Pt has hx of anemia  Pt states her throat is swollen on the right side

## 2021-05-07 NOTE — ED Provider Notes (Signed)
[facility] EMERGENCY DEPARTMENT Provider Note   CSN: [id number] Arrival date & time: 05/07/21  0127     History Chief Complaint  Patient presents with   Shortness of Breath    Kristi Shelton is a 30 y.o. female.  30 yo F with a chief complaint of shortness of breath.  This has been going on for couple days.  Worse when she lays back flat.  She denies cough or congestion.  Feels like she has a right-sided sore throat.  Was seen about a month ago at an outside emergency department and told that she had continued anemia.  She does not have a doctor that she sees normally.  She denies heavy menstrual bleeding.  Currently on her menstrual cycle.  Denies likelihood of being pregnant.  The history is provided by the patient.  Shortness of Breath Severity:  Moderate Onset quality:  Gradual Duration:  2 days Timing:  Constant Progression:  Unchanged Chronicity:  New Relieved by:  Nothing Worsened by:  Nothing Ineffective treatments:  None tried Associated symptoms: no chest pain, no fever, no headaches, no vomiting and no wheezing       Past Medical History:  Diagnosis Date   Anemia    Tonsil stone     There are no problems to display for this patient.   Past Surgical History:  Procedure Laterality Date   CESAREAN SECTION     TOOTH EXTRACTION     TUBAL LIGATION     WISDOM TOOTH EXTRACTION       OB History     Gravida  7   Para  6   Term  5   Preterm  1   AB  1   Living         SAB      IAB      Ectopic  1   Multiple      Live Births              Family History  Problem Relation Age of Onset   Cancer Other    Diabetes Other    Hypertension Other     Social History   Tobacco Use   Smoking status: Never   Smokeless tobacco: Never  Vaping Use   Vaping Use: Never used  Substance Use Topics   Alcohol use: Yes    Comment: occ   Drug use: No    Home Medications Prior to Admission medications   Medication Sig Start Date  End Date Taking? Authorizing Provider  ferrous sulfate 325 (65 FE) MG tablet Take 1 tablet (325 mg total) by mouth daily. 05/07/21  Yes Melene Plan, DO  lansoprazole (PREVACID) 30 MG capsule Take 1 capsule (30 mg total) by mouth daily at 12 noon. 08/22/20   Molpus, John, MD  magnesium 30 MG tablet Take 30 mg by mouth 2 (two) times daily.    [provider]  Multiple Vitamin (MULTIVITAMIN) tablet Take 1 tablet by mouth daily.    [provider]  naproxen (NAPROSYN) 375 MG tablet Take 1 tablet twice daily as needed for pain. 08/22/20   Molpus, John, MD    Allergies    Amoxil [amoxicillin] and Penicillins  Review of Systems   Review of Systems  Constitutional:  Negative for chills and fever.  HENT:  Negative for congestion and rhinorrhea.   Eyes:  Negative for redness and visual disturbance.  Respiratory:  Positive for shortness of breath. Negative for wheezing.   Cardiovascular:  Negative for chest pain and palpitations.  Gastrointestinal:  Negative for nausea and vomiting.  Genitourinary:  Negative for dysuria and urgency.  Musculoskeletal:  Negative for arthralgias and myalgias.  Skin:  Negative for pallor and wound.  Neurological:  Negative for dizziness and headaches.   Physical Exam Updated Vital Signs BP 103/73 (BP Location: Left Arm)   Pulse 80   Temp 97.8 F (36.6 C) (Oral)   Resp 16   Ht 5\' 1"  (1.549 m)   Wt 63.3 kg   LMP 05/03/2021 (Exact Date)   SpO2 100%   BMI 26.36 kg/m   Physical Exam Vitals and nursing note reviewed.  Constitutional:      General: She is not in acute distress.    Appearance: She is well-developed. She is not diaphoretic.  HENT:     Head: Normocephalic and atraumatic.  Eyes:     Pupils: Pupils are equal, round, and reactive to light.  Cardiovascular:     Rate and Rhythm: Normal rate and regular rhythm.     Heart sounds: No murmur heard.   No friction rub. No gallop.  Pulmonary:     Effort: Pulmonary effort is normal.      Breath sounds: No wheezing or rales.  Abdominal:     General: There is no distension.     Palpations: Abdomen is soft.     Tenderness: There is no abdominal tenderness.  Musculoskeletal:        General: No tenderness.     Cervical back: Normal range of motion and neck supple.  Skin:    General: Skin is warm and dry.  Neurological:     Mental Status: She is alert and oriented to person, place, and time.  Psychiatric:        Behavior: Behavior normal.    ED Results / Procedures / Treatments   Labs (all labs ordered are listed, but only abnormal results are displayed) Labs Reviewed  CBC WITH DIFFERENTIAL/PLATELET - Abnormal; Notable for the following components:      Result Value   Hemoglobin 7.9 (*)    HCT 27.0 (*)    MCV 69.6 (*)    MCH 20.4 (*)    MCHC 29.3 (*)    RDW 17.7 (*)    All other components within normal limits  BASIC METABOLIC PANEL - Abnormal; Notable for the following components:   Potassium 3.2 (*)    Glucose, Bld 101 (*)    Calcium 8.8 (*)    All other components within normal limits  PREGNANCY, URINE    EKG EKG Interpretation  Date/Time:  Thursday May 07 2021 02:09:08 EDT Ventricular Rate:  71 PR Interval:  193 QRS Duration: 84 QT Interval:  383 QTC Calculation: 417 R Axis:   47 Text Interpretation: Sinus rhythm No significant change since last tracing Confirmed by 10-17-1986 [phone]) on 05/07/2021 2:10:54 AM  Radiology DG Chest 2 View  Result Date: 05/07/2021 CLINICAL DATA:  Shortness of breath EXAM: CHEST - 2 VIEW COMPARISON:  None. FINDINGS: The heart size and mediastinal contours are within normal limits. Both lungs are clear. The visualized skeletal structures are unremarkable. IMPRESSION: No active cardiopulmonary disease. Electronically Signed   By: 05/09/2021 M.D.   On: 05/07/2021 02:06    Procedures Procedures   Medications Ordered in ED Medications  potassium chloride SA (KLOR-CON) CR tablet 40 mEq (has no administration in  time range)    ED Course  I have reviewed the triage vital signs and the  nursing notes.  Pertinent labs & imaging results that were available during my care of the patient were reviewed by me and considered in my medical decision making (see chart for details).    MDM Rules/Calculators/A&P                           30 yo F with a chief complaint of shortness of breath.  This been going on for a couple days worse when she lays back flat.  She denies any infectious symptoms other than a sore throat.  Has some mild posterior nasal drip.  She is otherwise well-appearing and nontoxic.  Clear lung sounds.  We will repeat blood work as she had a microcytic anemia at last check.  This seems to be an ongoing issue looking at her prior lab work.  Of note she has had  21 ED visits in the past 4 years to an emergency department.  Is never sought primary care.  I discussed with her the necessity for primary care follow-up especially if she has a chronic anemia that could benefit from further work-up.  I will give her a referral to our hematologist that is on-call if she wishes.  Hemoglobin here is unchanged from check a few weeks ago.  Seems unlikely to be the cause of her symptoms.  Potassium is mildly low here as well.  2:43 AM:  I have discussed the diagnosis/risks/treatment options with the patient and family and believe the pt to be eligible for discharge home to follow-up with PCP, hematology. We also discussed returning to the ED immediately if new or worsening sx occur. We discussed the sx which are most concerning (e.g., sudden worsening pain, fever, inability to tolerate by mouth) that necessitate immediate return. Medications administered to the patient during their visit and any new prescriptions provided to the patient are listed below.  Medications given during this visit Medications  potassium chloride SA (KLOR-CON) CR tablet 40 mEq (has no administration in time range)     The patient  appears reasonably screen and/or stabilized for discharge and I doubt any other medical condition or other [facility] requiring further screening, evaluation, or treatment in the ED at this time prior to discharge.   Final Clinical Impression(s) / ED Diagnoses Final diagnoses:  Shortness of breath    Rx / DC Orders ED Discharge Orders          Ordered    ferrous sulfate 325 (65 FE) MG tablet  Daily        05/07/21 0238             Melene Plan, DO 05/07/21 [phone]

## 2021-05-07 NOTE — Discharge Instructions (Addendum)
Please follow up with a primary care provider.  I have attached information for a hematologist(blood doctor) if you wish to follow up with them in the office.

## 2021-05-13 ENCOUNTER — Telehealth: Payer: Self-pay | Admitting: Hematology

## 2021-05-13 NOTE — Telephone Encounter (Signed)
Called pt to sch new hem appt per staff msg from Dr. Mosetta Putt. No answer when I called and no vm was available. Will try to contact pt again. Appt is for severe anemia.

## 2021-05-18 ENCOUNTER — Telehealth: Payer: Self-pay | Admitting: Hematology

## 2021-05-18 NOTE — Telephone Encounter (Signed)
Called pt to sch new hem appt per 10/5 staff msg from Dr. Mosetta Putt. Pt's diagnosis for severe anemia. Called pt, no answer and no vm was available.

## 2021-05-19 ENCOUNTER — Telehealth: Payer: Self-pay | Admitting: Hematology

## 2021-05-19 NOTE — Telephone Encounter (Signed)
Called pt to sch new hem appt per 10/5 staff msg from Dr. Feng. Pt's diagnosis for severe anemia. Called pt, no answer and no vm was available.  

## 2021-05-21 ENCOUNTER — Telehealth: Payer: Self-pay | Admitting: Hematology

## 2021-05-21 NOTE — Telephone Encounter (Signed)
Called pt to sch new hem appt per 10/5 staff msg from Dr. Feng. Pt's diagnosis for severe anemia. Called pt, no answer and no vm was available.  

## 2021-05-24 ENCOUNTER — Other Ambulatory Visit: Payer: Self-pay

## 2021-05-24 ENCOUNTER — Encounter (HOSPITAL_BASED_OUTPATIENT_CLINIC_OR_DEPARTMENT_OTHER): Payer: Self-pay

## 2021-05-24 ENCOUNTER — Emergency Department (HOSPITAL_BASED_OUTPATIENT_CLINIC_OR_DEPARTMENT_OTHER): Payer: Medicaid Other

## 2021-05-24 ENCOUNTER — Emergency Department (HOSPITAL_BASED_OUTPATIENT_CLINIC_OR_DEPARTMENT_OTHER)
Admission: EM | Admit: 2021-05-24 | Discharge: 2021-05-24 | Disposition: A | Discharge status: Home / Self Care | Payer: Medicaid Other | Attending: Emergency Medicine | Admitting: Emergency Medicine

## 2021-05-24 DIAGNOSIS — R42 Dizziness and giddiness: Secondary | ICD-10-CM | POA: Diagnosis present

## 2021-05-24 DIAGNOSIS — R0602 Shortness of breath: Secondary | ICD-10-CM | POA: Diagnosis not present

## 2021-05-24 DIAGNOSIS — E860 Dehydration: Secondary | ICD-10-CM | POA: Insufficient documentation

## 2021-05-24 DIAGNOSIS — Z20822 Contact with and (suspected) exposure to covid-19: Secondary | ICD-10-CM | POA: Diagnosis not present

## 2021-05-24 LAB — CBC WITH DIFFERENTIAL/PLATELET
Abs Immature Granulocytes: 0.01 10*3/uL (ref 0.00–0.07)
Basophils Absolute: 0.1 10*3/uL (ref 0.0–0.1)
Basophils Relative: 1 %
Eosinophils Absolute: 0.1 10*3/uL (ref 0.0–0.5)
Eosinophils Relative: 1 %
HCT: 29 % — ABNORMAL LOW (ref 36.0–46.0)
Hemoglobin: 8.5 g/dL — ABNORMAL LOW (ref 12.0–15.0)
Immature Granulocytes: 0 %
Lymphocytes Relative: 28 %
Lymphs Abs: 1.7 10*3/uL (ref 0.7–4.0)
MCH: 20.3 pg — ABNORMAL LOW (ref 26.0–34.0)
MCHC: 29.3 g/dL — ABNORMAL LOW (ref 30.0–36.0)
MCV: 69.2 fL — ABNORMAL LOW (ref 80.0–100.0)
Monocytes Absolute: 0.5 10*3/uL (ref 0.1–1.0)
Monocytes Relative: 8 %
Neutro Abs: 3.8 10*3/uL (ref 1.7–7.7)
Neutrophils Relative %: 62 %
Platelets: 360 10*3/uL (ref 150–400)
RBC: 4.19 MIL/uL (ref 3.87–5.11)
RDW: 17.9 % — ABNORMAL HIGH (ref 11.5–15.5)
WBC: 6.1 10*3/uL (ref 4.0–10.5)
nRBC: 0 % (ref 0.0–0.2)

## 2021-05-24 LAB — URINALYSIS, ROUTINE W REFLEX MICROSCOPIC
Bilirubin Urine: NEGATIVE
Glucose, UA: NEGATIVE mg/dL
Hgb urine dipstick: NEGATIVE
Ketones, ur: 15 mg/dL — AB
Leukocytes,Ua: NEGATIVE
Nitrite: NEGATIVE
Protein, ur: NEGATIVE mg/dL
Specific Gravity, Urine: 1.01 (ref 1.005–1.030)
pH: 6.5 (ref 5.0–8.0)

## 2021-05-24 LAB — PREGNANCY, URINE: Preg Test, Ur: NEGATIVE

## 2021-05-24 LAB — BASIC METABOLIC PANEL
Anion gap: 6 (ref 5–15)
BUN: 7 mg/dL (ref 6–20)
CO2: 25 mmol/L (ref 22–32)
Calcium: 9.1 mg/dL (ref 8.9–10.3)
Chloride: 105 mmol/L (ref 98–111)
Creatinine, Ser: 0.88 mg/dL (ref 0.44–1.00)
GFR, Estimated: >60 mL/min (ref >60)
Glucose, Bld: 101 mg/dL — ABNORMAL HIGH (ref 70–99)
Potassium: 3.5 mmol/L (ref 3.5–5.1)
Sodium: 136 mmol/L (ref 135–145)

## 2021-05-24 LAB — RESP PANEL BY RT-PCR (FLU A&B, COVID) ARPGX2
Influenza A by PCR: NEGATIVE
Influenza B by PCR: NEGATIVE
SARS Coronavirus 2 by RT PCR: NEGATIVE

## 2021-05-24 MED ORDER — MAGNESIUM OXIDE -MG SUPPLEMENT 400 (240 MG) MG PO TABS
800.0000 mg | ORAL_TABLET | Freq: Once | ORAL | Status: AC
  Administered 2021-05-24: 800 mg via ORAL
  Filled 2021-05-24: qty 2

## 2021-05-24 MED ORDER — LACTATED RINGERS IV BOLUS
1000.0000 mL | Freq: Once | INTRAVENOUS | Status: AC
  Administered 2021-05-24: 1000 mL via INTRAVENOUS

## 2021-05-24 MED ORDER — POTASSIUM CHLORIDE CRYS ER 20 MEQ PO TBCR
40.0000 meq | EXTENDED_RELEASE_TABLET | Freq: Once | ORAL | Status: AC
  Administered 2021-05-24: 40 meq via ORAL
  Filled 2021-05-24: qty 2

## 2021-05-24 NOTE — ED Notes (Signed)
Pt NAD, a/ox4. Pt verbalizes understanding of all DC and f/u instructions. All questions answered. Pt walks with steady gait to lobby at DC.  ? ?

## 2021-05-24 NOTE — ED Provider Notes (Signed)
[facility] EMERGENCY DEPARTMENT Provider Note   CSN: [id number] Arrival date & time: 05/24/21  1336     History Chief Complaint  Patient presents with   Dizziness    Kristi Shelton is a 30 y.o. female.   Dizziness Associated symptoms: shortness of breath   Associated symptoms: no chest pain, no diarrhea, no headaches, no nausea, no palpitations, no vomiting and no weakness   Patient presents for feeling dizzy, cold, lightheaded, and short of breath.  She reported history of anemia.  She is currently prescribed iron supplementation.  Per chart review, baseline anemia with hemoglobin in the range of 7.5-8.5.  She was last seen in the ED 2.5 weeks ago for similar symptoms.  She has frequent ED visits and has not been able to establish primary care.  She presents to the ED today due to some intermittent symptoms of lightheadedness.  She felt that she was dehydrated and took some oral "liquid IV".  After she took this, she felt chills.  She has, at times, felt short of breath but denies any chest tightness or chest discomfort.  She denies any recent cough, rhinorrhea, or fevers.  Currently, she denies any areas of pain.  She is concerned about her blood counts.    Past Medical History:  Diagnosis Date   Anemia    Tonsil stone     There are no problems to display for this patient.   Past Surgical History:  Procedure Laterality Date   CESAREAN SECTION     TOOTH EXTRACTION     TUBAL LIGATION     WISDOM TOOTH EXTRACTION       OB History     Gravida  7   Para  6   Term  5   Preterm  1   AB  1   Living         SAB      IAB      Ectopic  1   Multiple      Live Births              Family History  Problem Relation Age of Onset   Cancer Other    Diabetes Other    Hypertension Other     Social History   Tobacco Use   Smoking status: Never   Smokeless tobacco: Never  Vaping Use   Vaping Use: Never used  Substance Use Topics   Alcohol  use: Yes    Comment: occ   Drug use: No    Home Medications Prior to Admission medications   Medication Sig Start Date End Date Taking? Authorizing Provider  ferrous sulfate 325 (65 FE) MG tablet Take 1 tablet (325 mg total) by mouth daily. 05/07/21  Yes Melene Plan, DO  lansoprazole (PREVACID) 30 MG capsule Take 1 capsule (30 mg total) by mouth daily at 12 noon. 08/22/20  Yes Molpus, John, MD  magnesium 30 MG tablet Take 30 mg by mouth 2 (two) times daily.    [provider]  Multiple Vitamin (MULTIVITAMIN) tablet Take 1 tablet by mouth daily.    [provider]  naproxen (NAPROSYN) 375 MG tablet Take 1 tablet twice daily as needed for pain. 08/22/20   Molpus, John, MD    Allergies    Amoxil [amoxicillin] and Penicillins  Review of Systems   Review of Systems  Constitutional:  Negative for activity change, appetite change, chills, diaphoresis and fever.  HENT:  Negative for congestion, ear pain, rhinorrhea and  sore throat.   Eyes:  Negative for pain and visual disturbance.  Respiratory:  Positive for shortness of breath. Negative for cough, chest tightness and wheezing.   Cardiovascular:  Negative for chest pain, palpitations and leg swelling.  Gastrointestinal:  Negative for abdominal pain, diarrhea, nausea and vomiting.  Genitourinary:  Negative for dysuria, flank pain, hematuria, menstrual problem, pelvic pain and vaginal discharge.  Musculoskeletal:  Negative for arthralgias, back pain, joint swelling, myalgias and neck pain.  Skin:  Negative for color change and rash.  Neurological:  Positive for dizziness and light-headedness. Negative for seizures, syncope, facial asymmetry, speech difficulty, weakness, numbness and headaches.  Hematological:  Does not bruise/bleed easily.  Psychiatric/Behavioral:  Negative for confusion and decreased concentration.   All other systems reviewed and are negative.  Physical Exam Updated Vital Signs BP 102/74 (BP Location: Left  Arm)   Pulse 74   Temp 97.9 F (36.6 C) (Oral)   Resp 16   Ht 5\' 1"  (1.549 m)   Wt 63.3 kg   LMP 05/03/2021 (Exact Date)   SpO2 100%   BMI 26.36 kg/m   Physical Exam Vitals and nursing note reviewed.  Constitutional:      General: She is not in acute distress.    Appearance: Normal appearance. She is well-developed and normal weight. She is not ill-appearing, toxic-appearing or diaphoretic.  HENT:     Head: Normocephalic and atraumatic.     Right Ear: External ear normal.     Left Ear: External ear normal.     Nose: Nose normal. No congestion or rhinorrhea.     Mouth/Throat:     Pharynx: Oropharynx is clear.  Eyes:     General: No scleral icterus.    Extraocular Movements: Extraocular movements intact.     Conjunctiva/sclera: Conjunctivae normal.  Cardiovascular:     Rate and Rhythm: Normal rate and regular rhythm.     Heart sounds: No murmur heard. Pulmonary:     Effort: Pulmonary effort is normal. No respiratory distress.     Breath sounds: Normal breath sounds. No wheezing or rales.  Chest:     Chest wall: No tenderness.  Abdominal:     General: Abdomen is flat.     Palpations: Abdomen is soft.     Tenderness: There is no abdominal tenderness.  Musculoskeletal:     Cervical back: Normal range of motion and neck supple.     Right lower leg: No edema.     Left lower leg: No edema.  Skin:    General: Skin is warm and dry.     Capillary Refill: Capillary refill takes less than 2 seconds.     Coloration: Skin is not jaundiced or pale.  Neurological:     General: No focal deficit present.     Mental Status: She is alert and oriented to person, place, and time.     Cranial Nerves: No cranial nerve deficit.     Sensory: No sensory deficit.     Motor: No weakness.     Coordination: Coordination normal.  Psychiatric:        Mood and Affect: Mood normal.        Behavior: Behavior normal.    ED Results / Procedures / Treatments   Labs (all labs ordered are listed,  but only abnormal results are displayed) Labs Reviewed  CBC WITH DIFFERENTIAL/PLATELET - Abnormal; Notable for the following components:      Result Value   Hemoglobin 8.5 (*)    HCT  29.0 (*)    MCV 69.2 (*)    MCH 20.3 (*)    MCHC 29.3 (*)    RDW 17.9 (*)    All other components within normal limits  BASIC METABOLIC PANEL - Abnormal; Notable for the following components:   Glucose, Bld 101 (*)    All other components within normal limits  URINALYSIS, ROUTINE W REFLEX MICROSCOPIC - Abnormal; Notable for the following components:   Color, Urine STRAW (*)    Ketones, ur 15 (*)    All other components within normal limits  RESP PANEL BY RT-PCR (FLU A&B, COVID) [id number]  PREGNANCY, URINE    EKG EKG Interpretation  Date/Time:  Sunday May 24 2021 13:45:39 EDT Ventricular Rate:  95 PR Interval:  152 QRS Duration: 70 QT Interval:  344 QTC Calculation: 432 R Axis:   40 Text Interpretation: Normal sinus rhythm Normal ECG Confirmed by Gloris Manchester [phone]) on 05/24/2021 3:24:49 PM  Radiology DG Chest Portable 1 View  Result Date: 05/24/2021 CLINICAL DATA:  Shortness of breath and chest discomfort x2 weeks EXAM: PORTABLE CHEST 1 VIEW COMPARISON:  May 07, 2021 FINDINGS: The heart size and mediastinal contours are within normal limits. No focal consolidation. No pleural effusion. No pneumothorax. The visualized skeletal structures are unremarkable. IMPRESSION: No acute cardiopulmonary disease. Electronically Signed   By: Maudry Mayhew M.D.   On: 05/24/2021 16:23    Procedures Procedures   Medications Ordered in ED Medications  lactated ringers bolus 1,000 mL (0 mLs Intravenous Stopped 05/24/21 1754)  potassium chloride SA (KLOR-CON) CR tablet 40 mEq (40 mEq Oral Given 05/24/21 1827)  magnesium oxide (MAG-OX) tablet 800 mg (800 mg Oral Given 05/24/21 1827)    ED Course  I have reviewed the triage vital signs and the nursing notes.  Pertinent labs & imaging results that were  available during my care of the patient were reviewed by me and considered in my medical decision making (see chart for details).    MDM Rules/Calculators/A&P                          Patient presents for intermittent symptoms of lightheadedness and dizziness.  She has history of anemia and is concerned that this is worsened.  She is afebrile with normal heart rate and blood pressure upon arrival.  On exam, she is well-appearing.  Breathing is even and unlabored.  Given her concern for dehydration, IV fluids were given.  Laboratory work-up was initiated.  Work-up results were reassuring.  Hemoglobin is increased from her prior visit 2 weeks ago.  Potassium was at the low end of normal.  She was given replacement potassium and magnesium in the ED.  Following IV fluids, she had improved symptoms and did state that she wished to go home.  She is able to ambulate without difficulty or any syncopal symptoms.  She was advised to establish PCP care and to arrange follow-up.  Final Clinical Impression(s) / ED Diagnoses Final diagnoses:  Dehydration    Rx / DC Orders ED Discharge Orders     None        Gloris Manchester, MD 05/26/21 1335

## 2021-05-24 NOTE — ED Notes (Signed)
ED Provider at bedside. 

## 2021-05-24 NOTE — ED Triage Notes (Signed)
Pt c/o feeling cold, lightheaded and short of breath, feels like heart is "beating slow". Hx of anemia. States was told to come back to have platelets rechecked.

## 2021-05-28 ENCOUNTER — Encounter: Payer: Self-pay | Admitting: Nurse Practitioner

## 2021-05-28 ENCOUNTER — Ambulatory Visit: Payer: Medicaid Other | Admitting: Nurse Practitioner

## 2021-05-28 ENCOUNTER — Other Ambulatory Visit: Payer: Self-pay

## 2021-05-28 VITALS — BP 106/71 | HR 88 | Temp 98.0°F | Ht 63.0 in | Wt 133.1 lb

## 2021-05-28 DIAGNOSIS — Z0000 Encounter for general adult medical examination without abnormal findings: Secondary | ICD-10-CM | POA: Diagnosis not present

## 2021-05-28 DIAGNOSIS — R630 Anorexia: Secondary | ICD-10-CM

## 2021-05-28 DIAGNOSIS — Z130 Encounter for screening for diseases of the blood and blood-forming organs and certain disorders involving the immune mechanism: Secondary | ICD-10-CM

## 2021-05-28 DIAGNOSIS — Z131 Encounter for screening for diabetes mellitus: Secondary | ICD-10-CM

## 2021-05-28 DIAGNOSIS — R42 Dizziness and giddiness: Secondary | ICD-10-CM

## 2021-05-28 LAB — POCT GLYCOSYLATED HEMOGLOBIN (HGB A1C): Hemoglobin A1C: 5.6 % (ref 4.0–5.6)

## 2021-05-28 LAB — GLUCOSE, POCT (MANUAL RESULT ENTRY): POC Glucose: 95 mg/dl (ref 70–99)

## 2021-05-28 NOTE — Patient Instructions (Signed)
You were seen today in the [facility] to establish care. Labs were collected, results will be available via MyChart or, if abnormal, you will be contacted by clinic staff.  Please follow up in 2 wks for reevaluation of symptoms and to establish treatment plan.

## 2021-05-28 NOTE — Progress Notes (Signed)
[facility] Patient [facility] [address] [place], [place]  [address] Phone:  [phone]   Fax:  [phone] Subjective:   Patient ID: Kristi Shelton, female    DOB: June 21, 1991, 30 y.o.   MRN: [id number]  Chief Complaint  Patient presents with   Establish Care    ED 10/16. Provider asked she make appointment to be tested for sickle cell.    HPI Zayley Arras 30 y.o. female with history of  has a past medical history of Anemia and Tonsil stone. To the [facility] for follow up after ED visit. Patient states that she has been evaluated in the ED several times over the past few months for lightheadedness and dizziness. Was referred to the [facility] for evaluation for possible sickle cell disease. States that she would also like to be screened for lupus, has two close relatives with condition.   States that lightheadedness and dizziness occur throughout the day. Denies any worsening or improving factors. Also endorses decreased appetite, increase fatigue and nausea without vomiting x 2 days. Intermittent periods of  HA and blurred vision. Denies any symptoms during visit today.   Was informed in the ED that anemia could be possible cause of symptoms, prescribed iron supplements. Has not started  taking medication, has difficulty taking medications and/ swallowing pills.    Denies any problems with sleep hygiene or insomnia, typically sleeps 7-8hrs per night. Verbalizes recent increase in stressors. Moved from [place] 3 wks ago, but continues to return to the area frequently to help care for mother. Currently works as a Engineer, agricultural.   Denies any symptoms during today's visit. Denies any fever. Denies any  chest pain, shortness of breath, HA or dizziness. Denies any numbness or tingling.   Past Medical History:  Diagnosis Date   Anemia    Tonsil stone     Past Surgical History:  Procedure Laterality Date   CESAREAN SECTION     TOOTH EXTRACTION     TUBAL LIGATION      WISDOM TOOTH EXTRACTION      Family History  Problem Relation Age of Onset   Hypertension Mother    Cancer Mother    Diabetes Maternal Grandmother    Cancer Other    Diabetes Other    Hypertension Other     Social History   Socioeconomic History   Marital status: Single    Spouse name: Not on file   Number of children: Not on file   Years of education: Not on file   Highest education level: Not on file  Occupational History   Not on file  Tobacco Use   Smoking status: Never   Smokeless tobacco: Never  Vaping Use   Vaping Use: Never used  Substance and Sexual Activity   Alcohol use: Yes    Comment: occ   Drug use: No   Sexual activity: Yes    Birth control/protection: None  Other Topics Concern   Not on file  Social History Narrative   Not on file   Social Determinants of Health   Financial Resource Strain: Not on file  Food Insecurity: Not on file  Transportation Needs: Not on file  Physical Activity: Not on file  Stress: Not on file  Social Connections: Not on file  Intimate Partner Violence: Not on file    Outpatient Medications Prior to Visit  Medication Sig Dispense Refill   lansoprazole (PREVACID) 30 MG capsule Take 1 capsule (30 mg total) by mouth daily  at 12 noon. 30 capsule 0   magnesium 30 MG tablet Take 30 mg by mouth 2 (two) times daily.     Multiple Vitamin (MULTIVITAMIN) tablet Take 1 tablet by mouth daily.     omeprazole (PRILOSEC) 10 MG capsule Take 10 mg by mouth daily.     ondansetron (ZOFRAN) 8 MG tablet Take by mouth.     ferrous sulfate 325 (65 FE) MG tablet Take 1 tablet (325 mg total) by mouth daily. (Patient not taking: Reported on 05/28/2021) 30 tablet 0   naproxen (NAPROSYN) 250 MG tablet Take 250 mg by mouth 2 (two) times daily as needed.     naproxen (NAPROSYN) 375 MG tablet Take 1 tablet twice daily as needed for pain. 20 tablet 0   No facility-administered medications prior to visit.    Allergies  Allergen Reactions    Penicillins Anxiety and Anaphylaxis    Has patient had a PCN reaction causing immediate rash, facial/tongue/throat swelling, SOB or lightheadedness with hypotension: N Has patient had a PCN reaction causing severe rash involving mucus membranes or skin necrosis: N Has patient had a PCN reaction that required hospitalization: Y Has patient had a PCN reaction occurring within the last 10 years: Y If all of the above answers are "NO", then may proceed with Cephalosporin use.  Other reaction(s): unknown, Unknown, Wheezing Has patient had a PCN reaction causing immediate rash, facial/tongue/throat swelling, SOB or lightheadedness with hypotension: N Has patient had a PCN reaction causing severe rash involving mucus membranes or skin necrosis: N Has patient had a PCN reaction that required hospitalization: Y Has patient had a PCN reaction occurring within the last 10 years: Y If all of the above answers are "NO", then may proceed with Cephalosporin use.    Amoxil [Amoxicillin]     Review of Systems  Constitutional:  Negative for chills, fever and malaise/fatigue.  Eyes:  Positive for blurred vision. Negative for pain, discharge and redness.  Respiratory:  Negative for cough and shortness of breath.   Cardiovascular:  Negative for chest pain, palpitations and leg swelling.  Gastrointestinal:  Negative for abdominal pain, blood in stool, constipation, diarrhea, nausea and vomiting.  Musculoskeletal: Negative.   Skin: Negative.   Neurological:  Positive for dizziness and headaches. Negative for tingling, tremors, sensory change, speech change, focal weakness, seizures and weakness.  Psychiatric/Behavioral:  Negative for depression, memory loss and substance abuse. The patient is not nervous/anxious and does not have insomnia.   All other systems reviewed and are negative.     Objective:    Physical Exam Vitals reviewed.  Constitutional:      General: She is not in acute distress.     Appearance: Normal appearance.  HENT:     Head: Normocephalic.     Right Ear: Tympanic membrane, ear canal and external ear normal.     Left Ear: Tympanic membrane, ear canal and external ear normal.     Nose: Nose normal.     Mouth/Throat:     Mouth: Mucous membranes are moist.     Pharynx: Oropharynx is clear.  Eyes:     Extraocular Movements: Extraocular movements intact.     Conjunctiva/sclera: Conjunctivae normal.     Pupils: Pupils are equal, round, and reactive to light.  Cardiovascular:     Rate and Rhythm: Normal rate and regular rhythm.     Pulses: Normal pulses.     Heart sounds: Normal heart sounds.     Comments: No obvious peripheral edema Pulmonary:  Effort: Pulmonary effort is normal.     Breath sounds: Normal breath sounds.  Musculoskeletal:        General: Normal range of motion.     Cervical back: Normal range of motion and neck supple.  Skin:    General: Skin is warm and dry.     Capillary Refill: Capillary refill takes less than 2 seconds.  Neurological:     General: No focal deficit present.     Mental Status: She is alert and oriented to person, place, and time.  Psychiatric:        Mood and Affect: Mood normal.        Behavior: Behavior normal.        Thought Content: Thought content normal.        Judgment: Judgment normal.    BP 106/71   Pulse 88   Temp 98 F (36.7 C)   Ht 5\' 3"  (1.6 m)   Wt 133 lb 0.8 oz (60.4 kg)   LMP 05/27/2021 (Exact Date)   SpO2 98%   BMI 23.57 kg/m  Wt Readings from Last 3 Encounters:  05/28/21 133 lb 0.8 oz (60.4 kg)  05/24/21 139 lb 8 oz (63.3 kg)  05/07/21 139 lb 8 oz (63.3 kg)     There is no immunization history on file for this patient.  Diabetic Foot Exam - Simple   No data filed     No results found for: TSH Lab Results  Component Value Date   WBC 6.1 05/24/2021   HGB 8.5 (L) 05/24/2021   HCT 29.0 (L) 05/24/2021   MCV 69.2 (L) 05/24/2021   PLT 360 05/24/2021   Lab Results  Component  Value Date   NA 136 05/24/2021   K 3.5 05/24/2021   CO2 25 05/24/2021   GLUCOSE 101 (H) 05/24/2021   BUN 7 05/24/2021   CREATININE 0.88 05/24/2021   BILITOT 0.8 12/18/2017   ALKPHOS 61 12/18/2017   AST 21 12/18/2017   ALT 12 (L) 12/18/2017   PROT 8.0 12/18/2017   ALBUMIN 3.7 12/18/2017   CALCIUM 9.1 05/24/2021   ANIONGAP 6 05/24/2021   No results found for: CHOL No results found for: HDL No results found for: LDLCALC No results found for: TRIG No results found for: CHOLHDL Lab Results  Component Value Date   HGBA1C 5.6 05/28/2021       Assessment & Plan:   Problem List Items Addressed This Visit   None Visit Diagnoses     Screening for diabetes mellitus    -  Primary   Relevant Orders   HgB A1c (Completed)   Glucose (CBG) (Completed)   Healthcare maintenance       Lightheadedness       Relevant Orders   CBC with Differential/Platelet   Comprehensive metabolic panel   Lipid panel   TSH+T4F+T3Free   Vitamin D, 25-hydroxy   Vitamin B12   ANA Comprehensive Panel Encouraged medications compliance, worsening anemia, could be contributing to symptoms Iron panel ordered   Decreased appetite       Relevant Orders   CBC with Differential/Platelet   Comprehensive metabolic panel   Lipid panel   TSH+T4F+T3Free   Vitamin D, 25-hydroxy   Vitamin B12 Discussed diet options, such as ensure, that may be helpful when appetite is decreased   Encounter for sickle-cell screening      HgB Fractionation ordered    Follow up in 2 wks ,after review of completed studies, to establish treatment plan further  for symptoms discussed during visit, sooner as needed    I have discontinued Xiamara Kusch's naproxen, naproxen, and ondansetron. I am also having her maintain her multivitamin, magnesium, lansoprazole, ferrous sulfate, and omeprazole.  No orders of the defined types were placed in this encounter.    Kathrynn Speed, NP

## 2021-05-29 LAB — CBC WITH DIFFERENTIAL/PLATELET
Basophils Absolute: 0 10*3/uL (ref 0.0–0.2)
Basos: 1 %
EOS (ABSOLUTE): 0.1 10*3/uL (ref 0.0–0.4)
Eos: 1 %
Hematocrit: 28.9 % — ABNORMAL LOW (ref 34.0–46.6)
Hemoglobin: 8.2 g/dL — ABNORMAL LOW (ref 11.1–15.9)
Immature Grans (Abs): 0 10*3/uL (ref 0.0–0.1)
Immature Granulocytes: 0 %
Lymphocytes Absolute: 2.1 10*3/uL (ref 0.7–3.1)
Lymphs: 32 %
MCH: 20 pg — ABNORMAL LOW (ref 26.6–33.0)
MCHC: 28.4 g/dL — ABNORMAL LOW (ref 31.5–35.7)
MCV: 70 fL — ABNORMAL LOW (ref 79–97)
Monocytes Absolute: 0.4 10*3/uL (ref 0.1–0.9)
Monocytes: 6 %
Neutrophils Absolute: 4.1 10*3/uL (ref 1.4–7.0)
Neutrophils: 60 %
Platelets: 349 10*3/uL (ref 150–450)
RBC: 4.11 x10E6/uL (ref 3.77–5.28)
RDW: 16.8 % — ABNORMAL HIGH (ref 11.7–15.4)
WBC: 6.7 10*3/uL (ref 3.4–10.8)

## 2021-05-29 LAB — LIPID PANEL
Chol/HDL Ratio: 2.9 ratio (ref 0.0–4.4)
Cholesterol, Total: 157 mg/dL (ref 100–199)
HDL: 55 mg/dL (ref >39)
LDL Chol Calc (NIH): 94 mg/dL (ref 0–99)
Triglycerides: 32 mg/dL (ref 0–149)
VLDL Cholesterol Cal: 8 mg/dL (ref 5–40)

## 2021-05-29 LAB — COMPREHENSIVE METABOLIC PANEL
ALT: 6 IU/L (ref 0–32)
AST: 15 IU/L (ref 0–40)
Albumin/Globulin Ratio: 1.4 (ref 1.2–2.2)
Albumin: 4.6 g/dL (ref 3.8–4.8)
Alkaline Phosphatase: 68 IU/L (ref 44–121)
BUN/Creatinine Ratio: 16 (ref 9–23)
BUN: 11 mg/dL (ref 6–20)
Bilirubin Total: 0.3 mg/dL (ref 0.0–1.2)
CO2: 20 mmol/L (ref 20–29)
Calcium: 9.3 mg/dL (ref 8.7–10.2)
Chloride: 103 mmol/L (ref 96–106)
Creatinine, Ser: 0.7 mg/dL (ref 0.57–1.00)
Globulin, Total: 3.2 g/dL (ref 1.5–4.5)
Glucose: 78 mg/dL (ref 70–99)
Potassium: 3.9 mmol/L (ref 3.5–5.2)
Sodium: 137 mmol/L (ref 134–144)
Total Protein: 7.8 g/dL (ref 6.0–8.5)
eGFR: 119 mL/min/{1.73_m2} (ref >59)

## 2021-05-29 LAB — ANA COMPREHENSIVE PANEL
Anti JO-1: <0.2 AI (ref 0.0–0.9)
Centromere Ab Screen: 0.5 AI (ref 0.0–0.9)
Chromatin Ab SerPl-aCnc: <0.2 AI (ref 0.0–0.9)
ENA RNP Ab: <0.2 AI (ref 0.0–0.9)
ENA SM Ab Ser-aCnc: <0.2 AI (ref 0.0–0.9)
ENA SSA (RO) Ab: <0.2 AI (ref 0.0–0.9)
ENA SSB (LA) Ab: <0.2 AI (ref 0.0–0.9)
Scleroderma (Scl-70) (ENA) Antibody, IgG: <0.2 AI (ref 0.0–0.9)
dsDNA Ab: 1 IU/mL (ref 0–9)

## 2021-05-29 LAB — TSH+T4F+T3FREE
Free T4: 1.41 ng/dL (ref 0.82–1.77)
T3, Free: 2.9 pg/mL (ref 2.0–4.4)
TSH: 0.839 u[IU]/mL (ref 0.450–4.500)

## 2021-05-29 LAB — VITAMIN D 25 HYDROXY (VIT D DEFICIENCY, FRACTURES): Vit D, 25-Hydroxy: 24.9 ng/mL — ABNORMAL LOW (ref 30.0–100.0)

## 2021-05-29 LAB — VITAMIN B12: Vitamin B-12: 1565 pg/mL — ABNORMAL HIGH (ref 232–1245)

## 2021-06-01 ENCOUNTER — Other Ambulatory Visit: Payer: Self-pay | Admitting: Nurse Practitioner

## 2021-06-01 DIAGNOSIS — E559 Vitamin D deficiency, unspecified: Secondary | ICD-10-CM

## 2021-06-01 MED ORDER — VITAMIN D (ERGOCALCIFEROL) 1.25 MG (50000 UNIT) PO CAPS: 50000.0000 [IU] | ORAL_CAPSULE | ORAL | 0 refills | Status: DC

## 2021-06-07 ENCOUNTER — Emergency Department (HOSPITAL_BASED_OUTPATIENT_CLINIC_OR_DEPARTMENT_OTHER)
Admission: EM | Admit: 2021-06-07 | Discharge: 2021-06-07 | Disposition: A | Discharge status: Home / Self Care | Payer: Medicaid Other | Attending: Emergency Medicine | Admitting: Emergency Medicine

## 2021-06-07 ENCOUNTER — Other Ambulatory Visit: Payer: Self-pay

## 2021-06-07 ENCOUNTER — Encounter (HOSPITAL_BASED_OUTPATIENT_CLINIC_OR_DEPARTMENT_OTHER): Payer: Self-pay | Admitting: Emergency Medicine

## 2021-06-07 DIAGNOSIS — R0602 Shortness of breath: Secondary | ICD-10-CM | POA: Diagnosis not present

## 2021-06-07 DIAGNOSIS — M7989 Other specified soft tissue disorders: Secondary | ICD-10-CM | POA: Diagnosis not present

## 2021-06-07 DIAGNOSIS — T63481A Toxic effect of venom of other arthropod, accidental (unintentional), initial encounter: Secondary | ICD-10-CM

## 2021-06-07 MED ORDER — PREDNISONE 20 MG PO TABS: 40.0000 mg | ORAL_TABLET | Freq: Every day | ORAL | 0 refills | Status: DC

## 2021-06-07 MED ORDER — PREDNISONE 50 MG PO TABS
60.0000 mg | ORAL_TABLET | Freq: Once | ORAL | Status: AC
  Administered 2021-06-07: 60 mg via ORAL
  Filled 2021-06-07: qty 1

## 2021-06-07 MED ORDER — EPINEPHRINE 0.3 MG/0.3ML IJ SOAJ: 0.3000 mg | INTRAMUSCULAR | 0 refills | Status: AC | PRN

## 2021-06-07 NOTE — ED Provider Notes (Signed)
[facility] EMERGENCY DEPARTMENT Provider Note   CSN: [id number] Arrival date & time: 06/07/21  1443     History Chief Complaint  Patient presents with   Insect Bite   Shortness of Breath    Kristi Shelton is a 30 y.o. female with reported history of anemia.  Presents to Emergency Department with a chief complaint of insect sting.  Patient reports that yesterday approximately 1430 she was stung by a yellow jacket on her right thumb and right ankle.  Patient reports that she has swelling to affected areas as well as pain.  Pain previously was 8/10 on the pain scale.  Pain resolved after she took Benadryl and Tylenol prior to arriving in the emergency department.  Patient reports that today she felt like she was having shortness of breath, trouble swallowing, and also reports diarrhea.  Denies known history of bee sting allergy.  Patient reports that she has never been stung by bees before.   Shortness of Breath Associated symptoms: no abdominal pain, no chest pain, no fever, no headaches, no neck pain, no rash and no vomiting       Past Medical History:  Diagnosis Date   Anemia    Tonsil stone     There are no problems to display for this patient.   Past Surgical History:  Procedure Laterality Date   CESAREAN SECTION     TOOTH EXTRACTION     TUBAL LIGATION     WISDOM TOOTH EXTRACTION       OB History     Gravida  7   Para  6   Term  5   Preterm  1   AB  1   Living         SAB      IAB      Ectopic  1   Multiple      Live Births              Family History  Problem Relation Age of Onset   Hypertension Mother    Cancer Mother    Diabetes Maternal Grandmother    Cancer Other    Diabetes Other    Hypertension Other     Social History   Tobacco Use   Smoking status: Never   Smokeless tobacco: Never  Vaping Use   Vaping Use: Never used  Substance Use Topics   Alcohol use: Yes    Comment: occ   Drug use: No    Home  Medications Prior to Admission medications   Medication Sig Start Date End Date Taking? Authorizing Provider  ferrous sulfate 325 (65 FE) MG tablet Take 1 tablet (325 mg total) by mouth daily. Patient not taking: Reported on 05/28/2021 05/07/21   Melene Plan, DO  lansoprazole (PREVACID) 30 MG capsule Take 1 capsule (30 mg total) by mouth daily at 12 noon. 08/22/20   Molpus, John, MD  magnesium 30 MG tablet Take 30 mg by mouth 2 (two) times daily.    [provider]  Multiple Vitamin (MULTIVITAMIN) tablet Take 1 tablet by mouth daily.    [provider]  omeprazole (PRILOSEC) 10 MG capsule Take 10 mg by mouth daily. 01/25/21   [provider]  Vitamin D, Ergocalciferol, (DRISDOL) 1.25 MG (50000 UNIT) CAPS capsule Take 1 capsule (50,000 Units total) by mouth every 7 (seven) days for 8 doses. 06/01/21 07/21/21  Orion Crook I, NP    Allergies    Penicillins and Amoxil [amoxicillin]  Review  of Systems   Review of Systems  Constitutional:  Negative for chills and fever.  HENT:  Positive for trouble swallowing. Negative for facial swelling.   Eyes:  Negative for visual disturbance.  Respiratory:  Positive for shortness of breath.   Cardiovascular:  Negative for chest pain.  Gastrointestinal:  Positive for diarrhea. Negative for abdominal pain, nausea and vomiting.  Musculoskeletal:  Negative for back pain and neck pain.  Skin:  Negative for color change and rash.  Neurological:  Negative for dizziness, syncope, light-headedness and headaches.  Psychiatric/Behavioral:  Negative for confusion.    Physical Exam Updated Vital Signs BP 104/73 (BP Location: Right Arm)   Pulse 87   Temp (!) 97.5 F (36.4 C) (Oral)   Resp 16   Ht 5\' 3"  (1.6 m)   Wt 60.4 kg   LMP 05/27/2021 (Exact Date)   SpO2 100%   BMI 23.57 kg/m   Physical Exam Vitals and nursing note reviewed.  Constitutional:      General: She is not in acute distress.    Appearance: She is not  ill-appearing, toxic-appearing or diaphoretic.  HENT:     Head: Normocephalic.     Jaw: There is normal jaw occlusion. No trismus, tenderness, swelling, pain on movement or malocclusion.     Comments: No facial swelling noted.    Mouth/Throat:     Mouth: Mucous membranes are moist. No angioedema.     Tongue: No lesions. Tongue does not deviate from midline.     Palate: No mass and lesions.     Pharynx: Oropharynx is clear. Uvula midline. No pharyngeal swelling, oropharyngeal exudate, posterior oropharyngeal erythema or uvula swelling.     Tonsils: No tonsillar exudate or tonsillar abscesses. 1+ on the right. 1+ on the left.     Comments: Patient able to handle all secretions without difficulty. Eyes:     General: No scleral icterus.       Right eye: No discharge.        Left eye: No discharge.  Cardiovascular:     Rate and Rhythm: Normal rate.     Pulses:          Radial pulses are 2+ on the right side.       Dorsalis pedis pulses are 2+ on the right side.  Pulmonary:     Effort: Pulmonary effort is normal. No tachypnea, bradypnea or respiratory distress.     Breath sounds: Normal breath sounds. No stridor.     Comments: Speaks in full complete sentences without difficulty. Musculoskeletal:     Right wrist: Normal.     Right hand: Swelling present. No deformity, lacerations, tenderness or bony tenderness. Normal range of motion. Normal strength. Normal sensation. Normal capillary refill.     Right ankle: Swelling present. No deformity, ecchymosis or lacerations. No tenderness. Normal range of motion.     Right foot: Normal range of motion and normal capillary refill. No swelling, deformity, laceration, tenderness, bony tenderness or crepitus. Normal pulse.     Comments: Swelling and erythema to right thumb and lateral aspect of right ankle.  Skin:    General: Skin is warm and dry.  Neurological:     General: No focal deficit present.     Mental Status: She is alert.  Psychiatric:         Behavior: Behavior is cooperative.    ED Results / Procedures / Treatments   Labs (all labs ordered are listed, but only abnormal results are displayed) Labs Reviewed -  No data to display  EKG None  Radiology No results found.  Procedures Procedures   Medications Ordered in ED Medications  predniSONE (DELTASONE) tablet 60 mg (has no administration in time range)    ED Course  I have reviewed the triage vital signs and the nursing notes.  Pertinent labs & imaging results that were available during my care of the patient were reviewed by me and considered in my medical decision making (see chart for details).    MDM Rules/Calculators/A&P                           Alert 30 year old female no acute distress, nontoxic-appearing.  Presents to ED with chief complaint of bee sting.  Patient endorses shortness of breath, trouble swallowing, and swelling to right thumb and right ankle.  Patient reports that she was stung by a yellow jacket yesterday.  Patient speaks in focally sentences without difficulty.  Lungs clear to auscultation bilaterally.  Patient able to handle oral secretions without difficulty.  No angioedema, swelling to oral mucosa, or submandibular space.  Low suspicion for anaphylactic reaction at this time.  Will prescribe patient with short course of prednisone to help with inflammation.  Additionally will prescribe patient with EpiPen for future allergic reactions.  Discussed results, findings, treatment and follow up. Patient advised of return precautions. Patient verbalized understanding and agreed with plan.   Final Clinical Impression(s) / ED Diagnoses Final diagnoses:  None    Rx / DC Orders ED Discharge Orders     None        Haskel Schroeder, PA-C 06/07/21 1556    Milagros Loll, MD 06/07/21 1950

## 2021-06-07 NOTE — ED Triage Notes (Signed)
Pt c/o bee sting to right thumb and right ankle yesterday. Pt denies known allergies to bee stings, but has noticed shortness of breath since bee stings.

## 2021-06-07 NOTE — Discharge Instructions (Addendum)
You came to the emergency department today to be evaluated for your symptoms after being stung by yellow jacket yesterday.  Your physical exam was reassuring.  I have given you a short course of steroids to help with your swelling and symptoms.  Please take this medication as prescribed.  I have also given you prescription for an EpiPen.  Please read attached paperwork on the use of EpiPen's.  Some common side effects of prednisone include feelings of extra energy, feeling warm, increased appetite, and stomach upset.  If you are diabetic your sugars may run higher than usual.   Get help right away if: You have symptoms of a severe allergic reaction. These include: Wheezing or difficulty breathing. Tightness in the chest or chest pain. Light-headedness or fainting. Itchy, raised, red patches on the skin. Nausea or vomiting. Abdominal cramping. Diarrhea. A swollen tongue or lips, or trouble swallowing. Dizziness or fainting.

## 2021-06-12 ENCOUNTER — Encounter: Payer: Self-pay | Admitting: Nurse Practitioner

## 2021-06-12 ENCOUNTER — Ambulatory Visit: Payer: Medicaid Other | Admitting: Nurse Practitioner

## 2021-06-12 ENCOUNTER — Other Ambulatory Visit: Payer: Self-pay

## 2021-06-12 VITALS — BP 107/70 | HR 100 | Temp 97.8°F | Ht 63.0 in | Wt 135.0 lb

## 2021-06-12 DIAGNOSIS — Z113 Encounter for screening for infections with a predominantly sexual mode of transmission: Secondary | ICD-10-CM | POA: Diagnosis not present

## 2021-06-12 DIAGNOSIS — R630 Anorexia: Secondary | ICD-10-CM | POA: Diagnosis not present

## 2021-06-12 DIAGNOSIS — F419 Anxiety disorder, unspecified: Secondary | ICD-10-CM

## 2021-06-12 DIAGNOSIS — D649 Anemia, unspecified: Secondary | ICD-10-CM | POA: Diagnosis not present

## 2021-06-12 MED ORDER — LORAZEPAM 0.5 MG PO TABS: 0.5000 mg | ORAL_TABLET | Freq: Two times a day (BID) | ORAL | 1 refills | Status: DC | PRN

## 2021-06-12 MED ORDER — ENSURE ACTIVE HIGH PROTEIN PO LIQD: 1.0000 | Freq: Two times a day (BID) | ORAL | 10 refills | Status: AC

## 2021-06-12 NOTE — Patient Instructions (Addendum)
You were seen today in the [facility] for follow up. Labs were collected, results will be available via MyChart or, if abnormal, you will be contacted by clinic staff. You were prescribed medications, please take as directed. Please follow up in 2 mths for Pap smear.   Recommend purchasing wrist splint to be worn at night. Please take Ativan as needed for anxiety attacks.

## 2021-06-12 NOTE — Progress Notes (Signed)
Kristi Shelton,  [name]  [id number] Phone:  [phone]   Fax:  [phone] Subjective:   Patient ID: Kristi Shelton, female    DOB: Mar 20, 1991, 30 y.o.   MRN: [id number]  Chief Complaint  Patient presents with   Follow-up    2 week follow up, requesting  STD/ HIV testing. Left side wrist/hand numbness,    HPI Kristi Shelton 30 y.o. female  has a past medical history of Anemia and Tonsil stone. To the [facility] for reevaluation of anxiety. Patient states that she continues to have anxiety/ panic attacks, in addition to insomnia. Often feels tired and fatigued. Suspects symptoms related to web searching symptoms frequently, states," my husband thinks I am a hypochondriac." Verbalizes continued decreased appetite, has been drinking Ensure to improve appetite. Has chronic intermittent chest pain due to acid reflux, which typically resolves without intervention. Discussed option of completing counseling at previous visit, patient indicates that she is willing to try medication and counseling, requesting referral.  Patient also complaining of left wrist numbness that has been occurring x 1 yr. Numbness resolves without intervention, typically. Symptoms are worst at night. Moves wrist in different directions with some mild improvement in numbness. Currently unemployed and assists with caring for mother, was working as a Loss adjuster, chartered. Denies any pain. Denies any recent trauma or injury.  Requesting STD testing today. Has had 2 partners in the past 6 mths, both unprotected. Denies any symptoms. Denies any vaginal discharge or discomfort. Has tested positive for STD in the past, 1 yr ago, was successfully treated. Currently heterosexual.  Denies any other complaints today. Denies any fatigue, chest pain, shortness of breath, HA or dizziness. Denies any blurred vision, numbness or tingling.     Past Medical History:  Diagnosis Date   Anemia    Tonsil stone      Past Surgical History:  Procedure Laterality Date   CESAREAN SECTION     TOOTH EXTRACTION     TUBAL LIGATION     WISDOM TOOTH EXTRACTION      Family History  Problem Relation Age of Onset   Hypertension Mother    Cancer Mother    Diabetes Maternal Grandmother    Cancer Other    Diabetes Other    Hypertension Other     Social History   Socioeconomic History   Marital status: Single    Spouse name: Not on file   Number of children: Not on file   Years of education: Not on file   Highest education level: Not on file  Occupational History   Not on file  Tobacco Use   Smoking status: Never   Smokeless tobacco: Never  Vaping Use   Vaping Use: Never used  Substance and Sexual Activity   Alcohol use: Yes    Comment: occ   Drug use: No   Sexual activity: Yes    Birth control/protection: None  Other Topics Concern   Not on file  Social History Narrative   Not on file   Social Determinants of Health   Financial Resource Strain: Not on file  Food Insecurity: Not on file  Transportation Needs: Not on file  Physical Activity: Not on file  Stress: Not on file  Social Connections: Not on file  Intimate Partner Violence: Not on file    Outpatient Medications Prior to Visit  Medication Sig Dispense Refill   EPINEPHrine 0.3 mg/0.3 mL IJ SOAJ injection Inject 0.3 mg into the muscle as  needed for anaphylaxis. 1 each 0   Multiple Vitamin (MULTIVITAMIN) tablet Take 1 tablet by mouth daily.     ferrous sulfate 325 (65 FE) MG tablet Take 1 tablet (325 mg total) by mouth daily. (Patient not taking: No sig reported) 30 tablet 0   lansoprazole (PREVACID) 30 MG capsule Take 1 capsule (30 mg total) by mouth daily at 12 noon. (Patient not taking: Reported on 06/12/2021) 30 capsule 0   magnesium 30 MG tablet Take 30 mg by mouth 2 (two) times daily. (Patient not taking: Reported on 06/12/2021)     omeprazole (PRILOSEC) 10 MG capsule Take 10 mg by mouth daily. (Patient not taking:  Reported on 06/12/2021)     predniSONE (DELTASONE) 20 MG tablet Take 2 tablets (40 mg total) by mouth daily for 5 days. 10 tablet 0   Vitamin D, Ergocalciferol, (DRISDOL) 1.25 MG (50000 UNIT) CAPS capsule Take 1 capsule (50,000 Units total) by mouth every 7 (seven) days for 8 doses. 8 capsule 0   No facility-administered medications prior to visit.    Allergies  Allergen Reactions   Penicillins Anxiety and Anaphylaxis    Has patient had a PCN reaction causing immediate rash, facial/tongue/throat swelling, SOB or lightheadedness with hypotension: N Has patient had a PCN reaction causing severe rash involving mucus membranes or skin necrosis: N Has patient had a PCN reaction that required hospitalization: Y Has patient had a PCN reaction occurring within the last 10 years: Y If all of the above answers are "NO", then may proceed with Cephalosporin use.  Other reaction(s): unknown, Unknown, Wheezing Has patient had a PCN reaction causing immediate rash, facial/tongue/throat swelling, SOB or lightheadedness with hypotension: N Has patient had a PCN reaction causing severe rash involving mucus membranes or skin necrosis: N Has patient had a PCN reaction that required hospitalization: Y Has patient had a PCN reaction occurring within the last 10 years: Y If all of the above answers are "NO", then may proceed with Cephalosporin use.    Amoxil [Amoxicillin]     Review of Systems  Constitutional:  Negative for chills, fever and malaise/fatigue.  Respiratory:  Negative for cough and shortness of breath.   Cardiovascular:  Negative for chest pain, palpitations and leg swelling.  Gastrointestinal:  Negative for abdominal pain, blood in stool, constipation, diarrhea, nausea and vomiting.  Genitourinary: Negative.   Musculoskeletal:  Positive for joint pain. Negative for back pain, falls, myalgias and neck pain.  Skin: Negative.   Neurological: Negative.   Psychiatric/Behavioral:  Negative for  depression. The patient is nervous/anxious.   All other systems reviewed and are negative.     Objective:    Physical Exam Vitals reviewed.  Constitutional:      General: She is not in acute distress.    Appearance: Normal appearance. She is normal weight.  HENT:     Head: Normocephalic.  Cardiovascular:     Rate and Rhythm: Normal rate and regular rhythm.     Pulses: Normal pulses.     Heart sounds: Normal heart sounds.     Comments: No obvious peripheral edema Pulmonary:     Effort: Pulmonary effort is normal.     Breath sounds: Normal breath sounds.  Musculoskeletal:        General: No swelling, tenderness, deformity or signs of injury. Normal range of motion.     Cervical back: Normal range of motion and neck supple.     Right lower leg: No edema.     Left lower  leg: No edema.  Skin:    General: Skin is warm and dry.     Capillary Refill: Capillary refill takes less than 2 seconds.  Neurological:     General: No focal deficit present.     Mental Status: She is alert and oriented to person, place, and time.  Psychiatric:        Mood and Affect: Mood normal.        Behavior: Behavior normal.        Thought Content: Thought content normal.        Judgment: Judgment normal.    BP 107/70   Pulse 100   Temp 97.8 F (36.6 C)   Ht _0  (1.6 m)   Wt 135 lb (61.2 kg)   LMP 05/27/2021 (Exact Date)   SpO2 100%   BMI 23.91 kg/m  Wt Readings from Last 3 Encounters:  06/12/21 135 lb (61.2 kg)  06/07/21 133 lb 0.8 oz (60.4 kg)  05/28/21 133 lb 0.8 oz (60.4 kg)     There is no immunization history on file for this patient.  Diabetic Foot Exam - Simple   No data filed     Lab Results  Component Value Date   TSH 0.839 05/28/2021   Lab Results  Component Value Date   WBC 6.7 05/28/2021   HGB 8.2 (L) 05/28/2021   HCT 28.9 (L) 05/28/2021   MCV 70 (L) 05/28/2021   PLT 349 05/28/2021   Lab Results  Component Value Date   NA 137 05/28/2021   K 3.9 05/28/2021    CO2 20 05/28/2021   GLUCOSE 78 05/28/2021   BUN 11 05/28/2021   CREATININE 0.70 05/28/2021   BILITOT 0.3 05/28/2021   ALKPHOS 68 05/28/2021   AST 15 05/28/2021   ALT 6 05/28/2021   PROT 7.8 05/28/2021   ALBUMIN 4.6 05/28/2021   CALCIUM 9.3 05/28/2021   ANIONGAP 6 05/24/2021   EGFR 119 05/28/2021   Lab Results  Component Value Date   CHOL 157 05/28/2021   Lab Results  Component Value Date   HDL 55 05/28/2021   Lab Results  Component Value Date   LDLCALC 94 05/28/2021   Lab Results  Component Value Date   TRIG 32 05/28/2021   Lab Results  Component Value Date   CHOLHDL 2.9 05/28/2021   Lab Results  Component Value Date   HGBA1C 5.6 05/28/2021       Assessment & Plan:  With regard to fatigue, concerned for S/S of anemia v somatic symptoms/ psychosocial   Ordered completed as below: Problem List Items Addressed This Visit   None Visit Diagnoses     Anemia, unspecified type    -  Primary   Relevant Orders   Iron, TIBC and Ferritin Panel Discussed diet option to assist with management of anemia    Screening for STD (sexually transmitted disease)       Relevant Orders   RPR   HIV antibody (with reflex)   HSV(herpes simplex vrs) 1+2 ab-IgG   NuSwab Vaginitis Plus (VG+) Discussed safe sex practices    Anxiety       Relevant Medications   LORazepam (ATIVAN) 0.5 MG tablet, ordered to assist with management of panic attacks Referred to in clinic LCSW for new counseling  Discussed non pharmacological methods for managing anxiety    Decreased appetite       Relevant Medications   Nutritional Supplements (ENSURE ACTIVE HIGH PROTEIN) LIQD Discussed methods for improving appetite  Follow up in 2 mths for reevaluation of anxiety/ anemia/ decreased appetite, sooner as needed    I have discontinued Nashonda Mcclaran's Vitamin D (Ergocalciferol) and predniSONE. I am also having her start on LORazepam and Ensure Active High Protein. Additionally, I am having her  maintain her multivitamin, magnesium, lansoprazole, ferrous sulfate, omeprazole, and EPINEPHrine.  Meds ordered this encounter  Medications   LORazepam (ATIVAN) 0.5 MG tablet    Sig: Take 1 tablet (0.5 mg total) by mouth 2 (two) times daily as needed for anxiety.    Dispense:  15 tablet    Refill:  1   Nutritional Supplements (ENSURE ACTIVE HIGH PROTEIN) LIQD    Sig: Take 1 Can by mouth in the morning and at bedtime.    Dispense:  2000 mL    Refill:  [place], NP

## 2021-06-13 LAB — IRON,TIBC AND FERRITIN PANEL
Ferritin: 4 ng/mL — ABNORMAL LOW (ref 15–150)
Iron Saturation: 4 % — CL (ref 15–55)
Iron: 19 ug/dL — ABNORMAL LOW (ref 27–159)
Total Iron Binding Capacity: 508 ug/dL — ABNORMAL HIGH (ref 250–450)
UIBC: 489 ug/dL — ABNORMAL HIGH (ref 131–425)

## 2021-06-13 LAB — HSV(HERPES SIMPLEX VRS) I + II AB-IGG
HSV 1 Glycoprotein G Ab, IgG: <0.91 index (ref 0.00–0.90)
HSV 2 IgG, Type Spec: <0.91 index (ref 0.00–0.90)

## 2021-06-13 LAB — HIV ANTIBODY (ROUTINE TESTING W REFLEX): HIV Screen 4th Generation wRfx: NONREACTIVE

## 2021-06-13 LAB — RPR: RPR Ser Ql: NONREACTIVE

## 2021-06-15 ENCOUNTER — Other Ambulatory Visit: Payer: Self-pay | Admitting: Nurse Practitioner

## 2021-06-15 DIAGNOSIS — B379 Candidiasis, unspecified: Secondary | ICD-10-CM

## 2021-06-15 DIAGNOSIS — B9689 Other specified bacterial agents as the cause of diseases classified elsewhere: Secondary | ICD-10-CM

## 2021-06-15 DIAGNOSIS — D508 Other iron deficiency anemias: Secondary | ICD-10-CM

## 2021-06-15 DIAGNOSIS — N760 Acute vaginitis: Secondary | ICD-10-CM

## 2021-06-15 LAB — HGB FRACTIONATION CASCADE
Hgb A2: 2 % (ref 1.8–3.2)
Hgb A: 98 % (ref 96.4–98.8)
Hgb F: 0 % (ref 0.0–2.0)
Hgb S: 0 %

## 2021-06-15 LAB — IRON,TIBC AND FERRITIN PANEL

## 2021-06-15 MED ORDER — FLUCONAZOLE 150 MG PO TABS: 150.0000 mg | ORAL_TABLET | Freq: Once | ORAL | 0 refills | Status: AC

## 2021-06-15 MED ORDER — DOCUSATE SODIUM 100 MG PO CAPS: 100.0000 mg | ORAL_CAPSULE | Freq: Two times a day (BID) | ORAL | 0 refills | Status: DC | PRN

## 2021-06-15 MED ORDER — FERROUS SULFATE 325 (65 FE) MG PO TABS: 325.0000 mg | ORAL_TABLET | Freq: Every day | ORAL | 3 refills | Status: DC

## 2021-06-15 MED ORDER — METRONIDAZOLE 500 MG PO TABS: 500.0000 mg | ORAL_TABLET | Freq: Two times a day (BID) | ORAL | 0 refills | Status: DC

## 2021-06-16 ENCOUNTER — Other Ambulatory Visit: Payer: Self-pay | Admitting: Nurse Practitioner

## 2021-06-16 DIAGNOSIS — D508 Other iron deficiency anemias: Secondary | ICD-10-CM

## 2021-06-16 DIAGNOSIS — N760 Acute vaginitis: Secondary | ICD-10-CM

## 2021-06-16 DIAGNOSIS — B9689 Other specified bacterial agents as the cause of diseases classified elsewhere: Secondary | ICD-10-CM

## 2021-06-16 MED ORDER — METRONIDAZOLE 0.75 % VA GEL: 1.0000 | Freq: Every day | VAGINAL | 0 refills | Status: DC

## 2021-06-16 MED ORDER — FERROUS SULFATE 300 (60 FE) MG/5ML PO SYRP: 300.0000 mg | ORAL_SOLUTION | Freq: Every day | ORAL | 3 refills | Status: AC

## 2021-06-17 LAB — NUSWAB VAGINITIS PLUS (VG+)
Atopobium vaginae: HIGH Score — AB
BVAB 2: HIGH Score — AB
Candida albicans, NAA: POSITIVE — AB
Candida glabrata, NAA: NEGATIVE
Chlamydia trachomatis, NAA: NEGATIVE
Megasphaera 1: HIGH Score — AB
Neisseria gonorrhoeae, NAA: NEGATIVE
Trich vag by NAA: NEGATIVE

## 2021-06-23 ENCOUNTER — Institutional Professional Consult (permissible substitution): Payer: Medicaid Other | Admitting: Clinical

## 2021-06-23 ENCOUNTER — Emergency Department (HOSPITAL_BASED_OUTPATIENT_CLINIC_OR_DEPARTMENT_OTHER)
Admission: EM | Admit: 2021-06-23 | Discharge: 2021-06-23 | Disposition: A | Discharge status: Home / Self Care | Payer: Medicaid Other | Attending: Emergency Medicine | Admitting: Emergency Medicine

## 2021-06-23 ENCOUNTER — Encounter (HOSPITAL_BASED_OUTPATIENT_CLINIC_OR_DEPARTMENT_OTHER): Payer: Self-pay

## 2021-06-23 ENCOUNTER — Other Ambulatory Visit (HOSPITAL_BASED_OUTPATIENT_CLINIC_OR_DEPARTMENT_OTHER): Payer: Self-pay

## 2021-06-23 ENCOUNTER — Emergency Department (HOSPITAL_BASED_OUTPATIENT_CLINIC_OR_DEPARTMENT_OTHER): Payer: Medicaid Other

## 2021-06-23 ENCOUNTER — Other Ambulatory Visit: Payer: Self-pay

## 2021-06-23 DIAGNOSIS — R197 Diarrhea, unspecified: Secondary | ICD-10-CM | POA: Insufficient documentation

## 2021-06-23 DIAGNOSIS — J069 Acute upper respiratory infection, unspecified: Secondary | ICD-10-CM | POA: Insufficient documentation

## 2021-06-23 DIAGNOSIS — R1012 Left upper quadrant pain: Secondary | ICD-10-CM | POA: Diagnosis not present

## 2021-06-23 DIAGNOSIS — Z20822 Contact with and (suspected) exposure to covid-19: Secondary | ICD-10-CM | POA: Insufficient documentation

## 2021-06-23 DIAGNOSIS — R079 Chest pain, unspecified: Secondary | ICD-10-CM | POA: Insufficient documentation

## 2021-06-23 DIAGNOSIS — R1013 Epigastric pain: Secondary | ICD-10-CM | POA: Insufficient documentation

## 2021-06-23 DIAGNOSIS — R110 Nausea: Secondary | ICD-10-CM | POA: Insufficient documentation

## 2021-06-23 LAB — CBC WITH DIFFERENTIAL/PLATELET
Abs Immature Granulocytes: 0.03 10*3/uL (ref 0.00–0.07)
Basophils Absolute: 0.1 10*3/uL (ref 0.0–0.1)
Basophils Relative: 1 %
Eosinophils Absolute: 0.1 10*3/uL (ref 0.0–0.5)
Eosinophils Relative: 1 %
HCT: 28.7 % — ABNORMAL LOW (ref 36.0–46.0)
Hemoglobin: 8.4 g/dL — ABNORMAL LOW (ref 12.0–15.0)
Immature Granulocytes: 0 %
Lymphocytes Relative: 21 %
Lymphs Abs: 2.2 10*3/uL (ref 0.7–4.0)
MCH: 20.5 pg — ABNORMAL LOW (ref 26.0–34.0)
MCHC: 29.3 g/dL — ABNORMAL LOW (ref 30.0–36.0)
MCV: 70 fL — ABNORMAL LOW (ref 80.0–100.0)
Monocytes Absolute: 0.7 10*3/uL (ref 0.1–1.0)
Monocytes Relative: 6 %
Neutro Abs: 7.4 10*3/uL (ref 1.7–7.7)
Neutrophils Relative %: 71 %
Platelets: 334 10*3/uL (ref 150–400)
RBC: 4.1 MIL/uL (ref 3.87–5.11)
RDW: 19.5 % — ABNORMAL HIGH (ref 11.5–15.5)
WBC: 10.4 10*3/uL (ref 4.0–10.5)
nRBC: 0 % (ref 0.0–0.2)

## 2021-06-23 LAB — COMPREHENSIVE METABOLIC PANEL
ALT: 13 U/L (ref 0–44)
AST: 25 U/L (ref 15–41)
Albumin: 4 g/dL (ref 3.5–5.0)
Alkaline Phosphatase: 64 U/L (ref 38–126)
Anion gap: 10 (ref 5–15)
BUN: 9 mg/dL (ref 6–20)
CO2: 21 mmol/L — ABNORMAL LOW (ref 22–32)
Calcium: 9 mg/dL (ref 8.9–10.3)
Chloride: 104 mmol/L (ref 98–111)
Creatinine, Ser: 0.79 mg/dL (ref 0.44–1.00)
GFR, Estimated: >60 mL/min (ref >60)
Glucose, Bld: 101 mg/dL — ABNORMAL HIGH (ref 70–99)
Potassium: 3.2 mmol/L — ABNORMAL LOW (ref 3.5–5.1)
Sodium: 135 mmol/L (ref 135–145)
Total Bilirubin: 0.7 mg/dL (ref 0.3–1.2)
Total Protein: 8 g/dL (ref 6.5–8.1)

## 2021-06-23 LAB — URINALYSIS, MICROSCOPIC (REFLEX)

## 2021-06-23 LAB — RESP PANEL BY RT-PCR (FLU A&B, COVID) ARPGX2
Influenza A by PCR: NEGATIVE
Influenza B by PCR: NEGATIVE
SARS Coronavirus 2 by RT PCR: NEGATIVE

## 2021-06-23 LAB — URINALYSIS, ROUTINE W REFLEX MICROSCOPIC
Bilirubin Urine: NEGATIVE
Glucose, UA: NEGATIVE mg/dL
Ketones, ur: NEGATIVE mg/dL
Nitrite: NEGATIVE
Protein, ur: NEGATIVE mg/dL
Specific Gravity, Urine: 1.005 (ref 1.005–1.030)
pH: 6 (ref 5.0–8.0)

## 2021-06-23 LAB — LIPASE, BLOOD: Lipase: 41 U/L (ref 11–51)

## 2021-06-23 LAB — PREGNANCY, URINE: Preg Test, Ur: NEGATIVE

## 2021-06-23 MED ORDER — ONDANSETRON HCL 4 MG PO TABS
4.0000 mg | ORAL_TABLET | Freq: Four times a day (QID) | ORAL | 0 refills | Status: DC
  Filled 2021-06-23: qty 12, 3d supply, fill #0

## 2021-06-23 MED ORDER — ONDANSETRON HCL 4 MG/2ML IJ SOLN
4.0000 mg | Freq: Once | INTRAMUSCULAR | Status: AC
  Administered 2021-06-23: 4 mg via INTRAVENOUS
  Filled 2021-06-23: qty 2

## 2021-06-23 MED ORDER — SODIUM CHLORIDE 0.9 % IV BOLUS
1000.0000 mL | Freq: Once | INTRAVENOUS | Status: AC
  Administered 2021-06-23: 1000 mL via INTRAVENOUS

## 2021-06-23 NOTE — Discharge Instructions (Signed)
Recommend taking Zofran as needed for nausea.  Overall suspect that you may have foodborne illness.  Your COVID and influenza test are negative.  Work-up today is otherwise unremarkable.  Suspect that this will resolve on its own.  However if your diarrhea is persisting for more than 2 weeks recommend having your stool analyzed.

## 2021-06-23 NOTE — ED Provider Notes (Signed)
[facility] EMERGENCY DEPARTMENT Provider Note   CSN: [id number] Arrival date & time: 06/23/21  1017     History Chief Complaint  Patient presents with   Diarrhea   Abdominal Pain    Kristi Shelton is a 30 y.o. female.  The history is provided by the patient.  Diarrhea Quality:  Watery Severity:  Mild Onset quality:  Gradual Duration: days. Timing:  Intermittent Progression:  Improving Relieved by:  Nothing Worsened by:  Nothing Associated symptoms: abdominal pain, cough and URI   Associated symptoms: no arthralgias, no chills, no diaphoresis, no fever, no headaches, no myalgias and no vomiting   Risk factors: sick contacts   Abdominal Pain Pain location:  LUQ Pain quality: aching   Pain radiates to:  Chest Timing:  Intermittent Progression:  Waxing and waning Chronicity:  New Context: sick contacts   Associated symptoms: chest pain, diarrhea and nausea   Associated symptoms: no chills, no cough, no dysuria, no fever, no hematuria, no shortness of breath, no sore throat and no vomiting       Past Medical History:  Diagnosis Date   Anemia    Tonsil stone     There are no problems to display for this patient.   Past Surgical History:  Procedure Laterality Date   CESAREAN SECTION     TOOTH EXTRACTION     TUBAL LIGATION     WISDOM TOOTH EXTRACTION       OB History     Gravida  7   Para  6   Term  5   Preterm  1   AB  1   Living         SAB      IAB      Ectopic  1   Multiple      Live Births              Family History  Problem Relation Age of Onset   Hypertension Mother    Cancer Mother    Diabetes Maternal Grandmother    Cancer Other    Diabetes Other    Hypertension Other     Social History   Tobacco Use   Smoking status: Never   Smokeless tobacco: Never  Vaping Use   Vaping Use: Never used  Substance Use Topics   Alcohol use: Yes    Comment: occ   Drug use: No    Home Medications Prior to  Admission medications   Medication Sig Start Date End Date Taking? Authorizing Provider  docusate sodium (COLACE) 100 MG capsule Take 1 capsule (100 mg total) by mouth 2 (two) times daily as needed for mild constipation or moderate constipation. 06/15/21   Passmore, Jake Church I, NP  ondansetron (ZOFRAN) 4 MG tablet Take 1 tablet (4 mg total) by mouth every 6 (six) hours. 06/23/21  Yes Fredna Stricker, DO  EPINEPHrine 0.3 mg/0.3 mL IJ SOAJ injection Inject 0.3 mg into the muscle as needed for anaphylaxis. 06/07/21   Loni Beckwith, PA-C  ferrous sulfate 300 (60 Fe) MG/5ML syrup Take 5 mLs (300 mg total) by mouth daily. 06/16/21 10/14/21  Bo Merino I, NP  lansoprazole (PREVACID) 30 MG capsule Take 1 capsule (30 mg total) by mouth daily at 12 noon. Patient not taking: Reported on 06/12/2021 08/22/20   M[place]us, Jenny Reichmann, MD  LORazepam (ATIVAN) 0.5 MG tablet Take 1 tablet (0.5 mg total) by mouth 2 (two) times daily as needed for anxiety. 06/12/21   Bo Merino I,  NP  magnesium 30 MG tablet Take 30 mg by mouth 2 (two) times daily. Patient not taking: Reported on 06/12/2021    [provider]  Multiple Vitamin (MULTIVITAMIN) tablet Take 1 tablet by mouth daily.    [provider]  Nutritional Supplements (ENSURE ACTIVE HIGH PROTEIN) LIQD Take 1 Can by mouth in the morning and at bedtime. 06/12/21 07/12/21  Orion Crook I, NP  omeprazole (PRILOSEC) 10 MG capsule Take 10 mg by mouth daily. Patient not taking: Reported on 06/12/2021 01/25/21   [provider]    Allergies    Penicillins and Amoxil [amoxicillin]  Review of Systems   Review of Systems  Constitutional:  Negative for chills, diaphoresis and fever.  HENT:  Negative for ear pain and sore throat.   Eyes:  Negative for pain and visual disturbance.  Respiratory:  Negative for cough and shortness of breath.   Cardiovascular:  Positive for chest pain. Negative for palpitations.  Gastrointestinal:  Positive for  abdominal pain, diarrhea and nausea. Negative for vomiting.  Genitourinary:  Negative for dysuria and hematuria.  Musculoskeletal:  Negative for arthralgias, back pain and myalgias.  Skin:  Negative for color change and rash.  Neurological:  Negative for seizures, syncope and headaches.  All other systems reviewed and are negative.  Physical Exam Updated Vital Signs BP 104/60   Pulse 76   Temp 97.9 F (36.6 C) (Oral)   Resp 18   Ht 5\' 3"  (1.6 m)   Wt 61.2 kg   LMP 06/22/2021 (Exact Date)   SpO2 100%   BMI 23.91 kg/m   Physical Exam Vitals and nursing note reviewed.  Constitutional:      General: She is not in acute distress.    Appearance: She is well-developed. She is not ill-appearing.  HENT:     Head: Normocephalic and atraumatic.     Mouth/Throat:     Mouth: Mucous membranes are moist.  Eyes:     Extraocular Movements: Extraocular movements intact.     Conjunctiva/sclera: Conjunctivae normal.     Pupils: Pupils are equal, round, and reactive to light.  Cardiovascular:     Rate and Rhythm: Normal rate and regular rhythm.     Heart sounds: Normal heart sounds. No murmur heard. Pulmonary:     Effort: Pulmonary effort is normal. No respiratory distress.     Breath sounds: Normal breath sounds.  Abdominal:     General: There is no distension.     Palpations: Abdomen is soft.     Tenderness: There is abdominal tenderness in the epigastric area. There is no guarding or rebound.  Musculoskeletal:     Cervical back: Neck supple.  Skin:    General: Skin is warm and dry.     Capillary Refill: Capillary refill takes less than 2 seconds.  Neurological:     General: No focal deficit present.     Mental Status: She is alert.  Psychiatric:        Mood and Affect: Mood normal.    ED Results / Procedures / Treatments   Labs (all labs ordered are listed, but only abnormal results are displayed) Labs Reviewed  CBC WITH DIFFERENTIAL/PLATELET - Abnormal; Notable for the  following components:      Result Value   Hemoglobin 8.4 (*)    HCT 28.7 (*)    MCV 70.0 (*)    MCH 20.5 (*)    MCHC 29.3 (*)    RDW 19.5 (*)    All other  components within normal limits  COMPREHENSIVE METABOLIC PANEL - Abnormal; Notable for the following components:   Potassium 3.2 (*)    CO2 21 (*)    Glucose, Bld 101 (*)    All other components within normal limits  URINALYSIS, ROUTINE W REFLEX MICROSCOPIC - Abnormal; Notable for the following components:   Color, Urine STRAW (*)    Hgb urine dipstick LARGE (*)    Leukocytes,Ua TRACE (*)    All other components within normal limits  URINALYSIS, MICROSCOPIC (REFLEX) - Abnormal; Notable for the following components:   Bacteria, UA RARE (*)    All other components within normal limits  RESP PANEL BY RT-PCR (FLU A&B, COVID) ARPGX2  LIPASE, BLOOD  PREGNANCY, URINE    EKG EKG Interpretation  Date/Time:  Tuesday June 23 2021 10:28:12 EST Ventricular Rate:  87 PR Interval:  158 QRS Duration: 85 QT Interval:  367 QTC Calculation: 442 R Axis:   49 Text Interpretation: Sinus rhythm Confirmed by Lennice Sites ([id number]) on 06/23/2021 11:21:06 AM  Radiology DG Chest Portable 1 View  Result Date: 06/23/2021 CLINICAL DATA:  Pain EXAM: PORTABLE CHEST 1 VIEW COMPARISON:  05/24/2021 FINDINGS: The heart size and mediastinal contours are within normal limits. Both lungs are clear. No pleural effusion. No pneumothorax. The visualized skeletal structures are unremarkable. IMPRESSION: No acute process in the chest. Electronically Signed   By: Macy Mis M.D.   On: 06/23/2021 10:38    Procedures Procedures   Medications Ordered in ED Medications  sodium chloride 0.9 % bolus 1,000 mL (1,000 mLs Intravenous New Bag/Given 06/23/21 1110)  ondansetron (ZOFRAN) injection 4 mg (4 mg Intravenous Given 06/23/21 1108)    ED Course  I have reviewed the triage vital signs and the nursing notes.  Pertinent labs & imaging results that were  available during my care of the patient were reviewed by me and considered in my medical decision making (see chart for details).    MDM Rules/Calculators/A&P                           Makali Nordine is here with abdominal pain, diarrhea, nausea, left-sided chest pain.  Normal vitals.  No fever.  EKG shows sinus rhythm.  No ischemic changes.  Has been having diarrhea for the last several days.  Having nausea and dry heaving.  Pain mostly in the left upper abdomen area.  PERC negative and doubt PE.  Noncardiac sounding pain and seems to be more abdominal discomfort.  No concern for ACS.  Chest x-ray showed no pneumonia or pneumothorax.  Lab work showed no significant anemia, electrolyte abnormality, kidney injury.  Lipase normal doubt pancreatitis.  Gallbladder liver enzymes within normal limits and doubt cholecystitis.  She has been having loose stools for the last several days.  Suspect foodborne illness or viral illness.  Flu test and COVID test are negative.  Urinalysis negative for infection.  Pregnancy test negative.  Overall suspect that this will resolve on its own.  Feeling better after IV fluids and Zofran.  Able to eat and drink.  Discharged in good condition.  Recommend follow-up with primary care doctor.  No concern for appendicitis or other acute intra-abdominal process given unremarkable abdominal exam and reassuring vitals.  This chart was dictated using voice recognition software.  Despite best efforts to proofread,  errors can occur which can change the documentation meaning.   Final Clinical Impression(s) / ED Diagnoses Final diagnoses:  Diarrhea, unspecified type  Epigastric pain    Rx / DC Orders ED Discharge Orders          Ordered    ondansetron (ZOFRAN) 4 MG tablet  Every 6 hours        06/23/21 1213             [place], DO 06/23/21 1215

## 2021-06-23 NOTE — ED Triage Notes (Signed)
Pt presents to ED from home C/O L chest pain and diarrhea X 2 days.

## 2021-06-24 ENCOUNTER — Telehealth: Payer: Self-pay | Admitting: Clinical

## 2021-06-24 NOTE — Telephone Encounter (Signed)
Integrated Behavioral Health Case Management Referral Note  06/24/2021 Name: Kristi Shelton MRN: [id number] DOB: Mar 17, 1991 Kristi Shelton is a 30 y.o. year old female who sees Passmore, Enid Derry I, NP for primary care. LCSW was consulted to assess patient's needs and assist the patient with Mental Health Counseling and Resources.  Interpreter: No.   Interpreter Name & Language: none  Assessment: Patient experiencing Mental Health Concerns  - anxiety. Patient referred by PCP after last visit.  Intervention: CSW called patient after referral and scheduled Integrated Behavioral Health (IBH) consult for 06/23/21. Patient did not attend the appointment. Called patient today and patient unsure of when to reschedule for due to her dad being in the hospital. CSW to follow up.  Review of patient status, including review of consultants reports, relevant laboratory and other test results, and collaboration with appropriate care team members and the patient's provider was performed as part of comprehensive patient evaluation and provision of services.    Abigail Butts, LCSW [facility] [phone]

## 2021-06-29 MED ORDER — METRONIDAZOLE 0.75 % VA GEL: 1.0000 | Freq: Every day | VAGINAL | 0 refills | Status: AC

## 2021-07-01 ENCOUNTER — Other Ambulatory Visit (HOSPITAL_BASED_OUTPATIENT_CLINIC_OR_DEPARTMENT_OTHER): Payer: Self-pay

## 2021-07-02 ENCOUNTER — Emergency Department (HOSPITAL_BASED_OUTPATIENT_CLINIC_OR_DEPARTMENT_OTHER): Payer: Medicaid Other

## 2021-07-02 ENCOUNTER — Emergency Department (HOSPITAL_BASED_OUTPATIENT_CLINIC_OR_DEPARTMENT_OTHER)
Admission: EM | Admit: 2021-07-02 | Discharge: 2021-07-02 | Disposition: A | Discharge status: Home / Self Care | Payer: Medicaid Other | Attending: Emergency Medicine | Admitting: Emergency Medicine

## 2021-07-02 ENCOUNTER — Encounter (HOSPITAL_BASED_OUTPATIENT_CLINIC_OR_DEPARTMENT_OTHER): Payer: Self-pay | Admitting: Emergency Medicine

## 2021-07-02 ENCOUNTER — Other Ambulatory Visit: Payer: Self-pay

## 2021-07-02 DIAGNOSIS — R0789 Other chest pain: Secondary | ICD-10-CM

## 2021-07-02 DIAGNOSIS — N9489 Other specified conditions associated with female genital organs and menstrual cycle: Secondary | ICD-10-CM | POA: Insufficient documentation

## 2021-07-02 DIAGNOSIS — K219 Gastro-esophageal reflux disease without esophagitis: Secondary | ICD-10-CM

## 2021-07-02 DIAGNOSIS — K2100 Gastro-esophageal reflux disease with esophagitis, without bleeding: Secondary | ICD-10-CM | POA: Insufficient documentation

## 2021-07-02 LAB — COMPREHENSIVE METABOLIC PANEL
ALT: 10 U/L (ref 0–44)
AST: 19 U/L (ref 15–41)
Albumin: 3.7 g/dL (ref 3.5–5.0)
Alkaline Phosphatase: 62 U/L (ref 38–126)
Anion gap: 8 (ref 5–15)
BUN: 10 mg/dL (ref 6–20)
CO2: 23 mmol/L (ref 22–32)
Calcium: 8.5 mg/dL — ABNORMAL LOW (ref 8.9–10.3)
Chloride: 105 mmol/L (ref 98–111)
Creatinine, Ser: 0.66 mg/dL (ref 0.44–1.00)
GFR, Estimated: >60 mL/min (ref >60)
Glucose, Bld: 103 mg/dL — ABNORMAL HIGH (ref 70–99)
Potassium: 3 mmol/L — ABNORMAL LOW (ref 3.5–5.1)
Sodium: 136 mmol/L (ref 135–145)
Total Bilirubin: 0.3 mg/dL (ref 0.3–1.2)
Total Protein: 7.4 g/dL (ref 6.5–8.1)

## 2021-07-02 LAB — CBC WITH DIFFERENTIAL/PLATELET
Abs Immature Granulocytes: 0.02 10*3/uL (ref 0.00–0.07)
Basophils Absolute: 0.1 10*3/uL (ref 0.0–0.1)
Basophils Relative: 1 %
Eosinophils Absolute: 0.1 10*3/uL (ref 0.0–0.5)
Eosinophils Relative: 1 %
HCT: 26.1 % — ABNORMAL LOW (ref 36.0–46.0)
Hemoglobin: 7.5 g/dL — ABNORMAL LOW (ref 12.0–15.0)
Immature Granulocytes: 0 %
Lymphocytes Relative: 38 %
Lymphs Abs: 2.6 10*3/uL (ref 0.7–4.0)
MCH: 19.8 pg — ABNORMAL LOW (ref 26.0–34.0)
MCHC: 28.7 g/dL — ABNORMAL LOW (ref 30.0–36.0)
MCV: 69 fL — ABNORMAL LOW (ref 80.0–100.0)
Monocytes Absolute: 0.5 10*3/uL (ref 0.1–1.0)
Monocytes Relative: 7 %
Neutro Abs: 3.6 10*3/uL (ref 1.7–7.7)
Neutrophils Relative %: 53 %
Platelets: 444 10*3/uL — ABNORMAL HIGH (ref 150–400)
RBC: 3.78 MIL/uL — ABNORMAL LOW (ref 3.87–5.11)
RDW: 19 % — ABNORMAL HIGH (ref 11.5–15.5)
Smear Review: NORMAL
WBC: 6.9 10*3/uL (ref 4.0–10.5)
nRBC: 0 % (ref 0.0–0.2)

## 2021-07-02 LAB — HCG, SERUM, QUALITATIVE: Preg, Serum: NEGATIVE

## 2021-07-02 LAB — LIPASE, BLOOD: Lipase: 49 U/L (ref 11–51)

## 2021-07-02 LAB — TROPONIN I (HIGH SENSITIVITY): Troponin I (High Sensitivity): <2 ng/L (ref <18)

## 2021-07-02 MED ORDER — KETOROLAC TROMETHAMINE 30 MG/ML IJ SOLN
30.0000 mg | Freq: Once | INTRAMUSCULAR | Status: AC
  Administered 2021-07-02: 30 mg via INTRAVENOUS
  Filled 2021-07-02: qty 1

## 2021-07-02 MED ORDER — SODIUM CHLORIDE 0.9 % IV BOLUS
1000.0000 mL | Freq: Once | INTRAVENOUS | Status: AC
  Administered 2021-07-02: 1000 mL via INTRAVENOUS

## 2021-07-02 MED ORDER — SUCRALFATE 1 G PO TABS: 1.0000 g | ORAL_TABLET | Freq: Three times a day (TID) | ORAL | 0 refills | Status: DC

## 2021-07-02 MED ORDER — ONDANSETRON HCL 4 MG/2ML IJ SOLN
4.0000 mg | Freq: Once | INTRAMUSCULAR | Status: AC
  Administered 2021-07-02: 4 mg via INTRAVENOUS
  Filled 2021-07-02: qty 2

## 2021-07-02 NOTE — ED Provider Notes (Signed)
[facility] EMERGENCY DEPARTMENT Provider Note   CSN: [id number] Arrival date & time: 07/02/21  0021     History Chief Complaint  Patient presents with   chest wall pain     Kristi Shelton is a 30 y.o. female.  Patient is a 30 year old female with past medical history of anemia, prior C-section.  Patient presenting today with multiple complaints including pain in her chest, pain in both flanks, and "really bad reflux".  She states when she lays on her right side, she has pain to her right chest and sternum.  She also describes pain in both flanks that began 2 days ago in the absence of any injury or trauma.  She describes feeling bloated and constipated.  She denies to me she is having any fevers or chills.  She denies any diarrhea or bloody stool.  Patient seen here with similar complaints just over 1 week ago, however no cause has been found.  This is patient's ninth emergency department visit in less than 5 months.  The history is provided by the patient.      Past Medical History:  Diagnosis Date   Anemia    Tonsil stone     There are no problems to display for this patient.   Past Surgical History:  Procedure Laterality Date   CESAREAN SECTION     TOOTH EXTRACTION     TUBAL LIGATION     WISDOM TOOTH EXTRACTION       OB History     Gravida  7   Para  6   Term  5   Preterm  1   AB  1   Living         SAB      IAB      Ectopic  1   Multiple      Live Births              Family History  Problem Relation Age of Onset   Hypertension Mother    Cancer Mother    Diabetes Maternal Grandmother    Cancer Other    Diabetes Other    Hypertension Other     Social History   Tobacco Use   Smoking status: Never   Smokeless tobacco: Never  Vaping Use   Vaping Use: Never used  Substance Use Topics   Alcohol use: Yes    Comment: occ   Drug use: No    Home Medications Prior to Admission medications   Medication Sig Start Date  End Date Taking? Authorizing Provider  docusate sodium (COLACE) 100 MG capsule Take 1 capsule (100 mg total) by mouth 2 (two) times daily as needed for mild constipation or moderate constipation. 06/15/21   Passmore, Enid Derry I, NP  EPINEPHrine 0.3 mg/0.3 mL IJ SOAJ injection Inject 0.3 mg into the muscle as needed for anaphylaxis. 06/07/21   Haskel Schroeder, PA-C  ferrous sulfate 300 (60 Fe) MG/5ML syrup Take 5 mLs (300 mg total) by mouth daily. 06/16/21 10/14/21  Orion Crook I, NP  lansoprazole (PREVACID) 30 MG capsule Take 1 capsule (30 mg total) by mouth daily at 12 noon. Patient not taking: Reported on 06/12/2021 08/22/20   Molpus, Jonny Ruiz, MD  LORazepam (ATIVAN) 0.5 MG tablet Take 1 tablet (0.5 mg total) by mouth 2 (two) times daily as needed for anxiety. 06/12/21   Passmore, Enid Derry I, NP  magnesium 30 MG tablet Take 30 mg by mouth 2 (two) times daily. Patient not taking: Reported  on 06/12/2021    [provider]  Multiple Vitamin (MULTIVITAMIN) tablet Take 1 tablet by mouth daily.    [provider]  Nutritional Supplements (ENSURE ACTIVE HIGH PROTEIN) LIQD Take 1 Can by mouth in the morning and at bedtime. 06/12/21 07/12/21  Orion Crook I, NP  omeprazole (PRILOSEC) 10 MG capsule Take 10 mg by mouth daily. Patient not taking: Reported on 06/12/2021 01/25/21   [provider]  ondansetron (ZOFRAN) 4 MG tablet Take 1 tablet (4 mg total) by mouth every 6 (six) hours. 06/23/21   Curatolo, Adam, DO    Allergies    Penicillins and Amoxil [amoxicillin]  Review of Systems   Review of Systems  All other systems reviewed and are negative.  Physical Exam Updated Vital Signs BP 110/77 (BP Location: Right Arm)   Pulse 84   Temp 97.7 F (36.5 C) (Oral)   Resp 16   Ht 5\' 2"  (1.575 m)   Wt 61.2 kg   LMP 06/22/2021 (Exact Date)   SpO2 100%   BMI 24.69 kg/m   Physical Exam Vitals and nursing note reviewed.  Constitutional:      General: She is not in acute  distress.    Appearance: She is well-developed. She is not diaphoretic.  HENT:     Head: Normocephalic and atraumatic.  Cardiovascular:     Rate and Rhythm: Normal rate and regular rhythm.     Heart sounds: No murmur heard.   No friction rub. No gallop.  Pulmonary:     Effort: Pulmonary effort is normal. No respiratory distress.     Breath sounds: Normal breath sounds. No wheezing.  Abdominal:     General: Bowel sounds are normal. There is no distension.     Palpations: Abdomen is soft.     Tenderness: There is no abdominal tenderness. There is right CVA tenderness and left CVA tenderness.  Musculoskeletal:        General: Normal range of motion.     Cervical back: Normal range of motion and neck supple.  Skin:    General: Skin is warm and dry.  Neurological:     General: No focal deficit present.     Mental Status: She is alert and oriented to person, place, and time.    ED Results / Procedures / Treatments   Labs (all labs ordered are listed, but only abnormal results are displayed) Labs Reviewed - No data to display  EKG EKG Interpretation  Date/Time:  Thursday July 02 2021 00:58:46 EST Ventricular Rate:  76 PR Interval:  189 QRS Duration: 80 QT Interval:  369 QTC Calculation: 415 R Axis:   48 Text Interpretation: Sinus rhythm Normal ECG Confirmed by [id number] (Geoffery Lyons) on 07/02/2021 1:04:27 AM  Radiology No results found.  Procedures Procedures   Medications Ordered in ED Medications  ketorolac (TORADOL) 30 MG/ML injection 30 mg (has no administration in time range)  ondansetron (ZOFRAN) injection 4 mg (has no administration in time range)  sodium chloride 0.9 % bolus 1,000 mL (has no administration in time range)    ED Course  I have reviewed the triage vital signs and the nursing notes.  Pertinent labs & imaging results that were available during my care of the patient were reviewed by me and considered in my medical decision making (see chart for  details).    MDM Rules/Calculators/A&P  Patient presenting with multiple complaints as described in the HPI.  I am uncertain as to the etiology, but nothing today  appears emergent.    As far as her chest pain goes, I doubt a cardiac etiology.  Her EKG is unchanged and troponin is negative.  Oxygen saturations are 100% with no tachycardia or tachypnea and I highly doubt pulmonary embolism.  Chest x-ray is negative for pneumothorax or other abnormality.  The indigestion she is describing will be treated with continued use of her Prevacid.  I will prescribe Carafate.  She was also found to be anemic with hemoglobin of 7.5.  This is an ongoing issue for her.  Although 7.5 is below her baseline of 8-8.5, I see no indication for transfusion.  She tells me she has a prescription for iron supplement which she has not filled.  I have advised her to fill this medication and follow-up with primary doctor.  Nothing today seems emergent and I feel as though patient is safe for discharge.  Final Clinical Impression(s) / ED Diagnoses Final diagnoses:  None    Rx / DC Orders ED Discharge Orders     None        Geoffery Lyons, MD 07/02/21 0202

## 2021-07-02 NOTE — ED Triage Notes (Signed)
Pt is c/o chest wall pain  Pt states she has pain in her right chest when she lays down and it feels like her sternum in trying to come out of her chest and she is c/o pain on her left side around her rib area  States it is a burning pain  Pt also c/o acid reflux in which she is taking omeprazole and mylanta for but states it is not helping  Pt adds she feels bloated and feels constipated even though she had a bowel movement but states it was not much

## 2021-07-02 NOTE — ED Notes (Signed)
Pt refused resp panel. MD Delo made aware.

## 2021-07-02 NOTE — Discharge Instructions (Addendum)
Begin taking Carafate as prescribed.  Fill your prescription for iron that was previously prescribed and begin taking this medication as you are anemic.  Return to the emergency department if symptoms significantly worsen or change.

## 2021-07-13 ENCOUNTER — Other Ambulatory Visit: Payer: Self-pay

## 2021-07-13 ENCOUNTER — Encounter: Payer: Self-pay | Admitting: Nurse Practitioner

## 2021-07-13 ENCOUNTER — Ambulatory Visit: Payer: Medicaid Other | Admitting: Nurse Practitioner

## 2021-07-13 VITALS — BP 101/60 | HR 103 | Temp 98.3°F | Ht 63.0 in | Wt 137.1 lb

## 2021-07-13 DIAGNOSIS — R6889 Other general symptoms and signs: Secondary | ICD-10-CM | POA: Diagnosis not present

## 2021-07-13 DIAGNOSIS — K219 Gastro-esophageal reflux disease without esophagitis: Secondary | ICD-10-CM

## 2021-07-13 DIAGNOSIS — J018 Other acute sinusitis: Secondary | ICD-10-CM

## 2021-07-13 DIAGNOSIS — D509 Iron deficiency anemia, unspecified: Secondary | ICD-10-CM

## 2021-07-13 LAB — CBC WITH DIFFERENTIAL/PLATELET
Basophils Absolute: 0.1 10*3/uL (ref 0.0–0.2)
Basos: 1 %
EOS (ABSOLUTE): 0.1 10*3/uL (ref 0.0–0.4)
Eos: 2 %
Hematocrit: 30.9 % — ABNORMAL LOW (ref 34.0–46.6)
Hemoglobin: 8.9 g/dL — ABNORMAL LOW (ref 11.1–15.9)
Immature Grans (Abs): 0 10*3/uL (ref 0.0–0.1)
Immature Granulocytes: 1 %
Lymphocytes Absolute: 2.2 10*3/uL (ref 0.7–3.1)
Lymphs: 27 %
MCH: 20.5 pg — ABNORMAL LOW (ref 26.6–33.0)
MCHC: 28.8 g/dL — ABNORMAL LOW (ref 31.5–35.7)
MCV: 71 fL — ABNORMAL LOW (ref 79–97)
Monocytes Absolute: 0.7 10*3/uL (ref 0.1–0.9)
Monocytes: 8 %
Neutrophils Absolute: 5.2 10*3/uL (ref 1.4–7.0)
Neutrophils: 61 %
Platelets: 323 10*3/uL (ref 150–450)
RBC: 4.35 x10E6/uL (ref 3.77–5.28)
RDW: 19.7 % — ABNORMAL HIGH (ref 11.7–15.4)
WBC: 8.4 10*3/uL (ref 3.4–10.8)

## 2021-07-13 LAB — IRON,TIBC AND FERRITIN PANEL
Ferritin: 20 ng/mL (ref 15–150)
Iron Saturation: 4 % — CL (ref 15–55)
Iron: 20 ug/dL — ABNORMAL LOW (ref 27–159)
Total Iron Binding Capacity: 501 ug/dL — ABNORMAL HIGH (ref 250–450)
UIBC: 481 ug/dL — ABNORMAL HIGH (ref 131–425)

## 2021-07-13 LAB — POCT INFLUENZA A/B
Influenza A, POC: NEGATIVE
Influenza B, POC: NEGATIVE

## 2021-07-13 LAB — POCT URINE PREGNANCY: Preg Test, Ur: NEGATIVE

## 2021-07-13 MED ORDER — PANTOPRAZOLE SODIUM 40 MG PO TBEC: 40.0000 mg | DELAYED_RELEASE_TABLET | Freq: Every day | ORAL | 3 refills | Status: AC

## 2021-07-13 MED ORDER — DOXYCYCLINE CALCIUM 50 MG/5ML PO SYRP: 100.0000 mg | ORAL_SOLUTION | Freq: Two times a day (BID) | ORAL | 0 refills | Status: AC

## 2021-07-13 MED ORDER — FLUTICASONE PROPIONATE 50 MCG/ACT NA SUSP
2.0000 | Freq: Every day | NASAL | 0 refills | Status: DC
Start: 2021-07-13 — End: 2022-01-19

## 2021-07-13 MED ORDER — CETIRIZINE HCL 10 MG PO CHEW: 10.0000 mg | CHEWABLE_TABLET | Freq: Every day | ORAL | 2 refills | Status: AC

## 2021-07-13 NOTE — Progress Notes (Signed)
[address] [place], [place]  [address] Phone:  [phone]   Fax:  [phone] Subjective:   Patient ID: Kristi Shelton, female    DOB: 10-11-1990, 30 y.o.   MRN: [id number]  Chief Complaint  Patient presents with   Sinus Problem    Pt stated sinus pressure and headache has been feeling like this for 3 days also her acid reflux has been bothering her pt stated she has been very fatigue    HPI Alyiah Ulloa 30 y.o. female  has a past medical history of Anemia and Tonsil stone. To the [facility] for upper respiratory symptoms and fatigue.  Patient states that she has had sinus pressure and nasal congestion x 3 days. Also endorses night sweats. Denies any fever or sick contacts. Has not taken any medications for symptoms  Patient also concerned about recent flare up of acid reflux, with symptoms being the most pronounced at night. Has not taken any medications for symptoms. Was prescribed medications in the past, but states that she felt it was ineffective.   Currently compliant with all medications and has had an increased appetite since starting iron supplement. Denies having appropriate water/ fluid intake. Has also had cramps in the RLE that occurs intermittently x 2-3 days.  Denies any other complaints today. Denies any chest pain, shortness of breath, HA or dizziness. Denies any blurred vision, numbness or tingling.  Past Medical History:  Diagnosis Date   Anemia    Tonsil stone     Past Surgical History:  Procedure Laterality Date   CESAREAN SECTION     TOOTH EXTRACTION     TUBAL LIGATION     WISDOM TOOTH EXTRACTION      Family History  Problem Relation Age of Onset   Hypertension Mother    Cancer Mother    Diabetes Maternal Grandmother    Cancer Other    Diabetes Other    Hypertension Other     Social History   Socioeconomic History   Marital status: Single    Spouse name: Not on file   Number of children: Not on file    Years of education: Not on file   Highest education level: Not on file  Occupational History   Not on file  Tobacco Use   Smoking status: Never   Smokeless tobacco: Never  Vaping Use   Vaping Use: Never used  Substance and Sexual Activity   Alcohol use: Yes    Comment: occ   Drug use: No   Sexual activity: Yes    Birth control/protection: None  Other Topics Concern   Not on file  Social History Narrative   Not on file   Social Determinants of Health   Financial Resource Strain: Not on file  Food Insecurity: Not on file  Transportation Needs: Not on file  Physical Activity: Not on file  Stress: Not on file  Social Connections: Not on file  Intimate Partner Violence: Not on file    Outpatient Medications Prior to Visit  Medication Sig Dispense Refill   docusate sodium (COLACE) 100 MG capsule Take 1 capsule (100 mg total) by mouth 2 (two) times daily as needed for mild constipation or moderate constipation. (Patient not taking: Reported on 07/13/2021) 30 capsule 0   ferrous sulfate 300 (60 Fe) MG/5ML syrup Take 5 mLs (300 mg total) by mouth daily. 150 mL 3   Multiple Vitamin (MULTIVITAMIN) tablet Take 1 tablet by mouth daily.  sucralfate (CARAFATE) 1 g tablet Take 1 tablet (1 g total) by mouth 4 (four) times daily -  with meals and at bedtime. 90 tablet 0   EPINEPHrine 0.3 mg/0.3 mL IJ SOAJ injection Inject 0.3 mg into the muscle as needed for anaphylaxis. 1 each 0   LORazepam (ATIVAN) 0.5 MG tablet Take 1 tablet (0.5 mg total) by mouth 2 (two) times daily as needed for anxiety. (Patient not taking: Reported on 07/13/2021) 15 tablet 1   magnesium 30 MG tablet Take 30 mg by mouth 2 (two) times daily. (Patient not taking: Reported on 06/12/2021)     ondansetron (ZOFRAN) 4 MG tablet Take 1 tablet (4 mg total) by mouth every 6 (six) hours. (Patient not taking: Reported on 07/13/2021) 12 tablet 0   lansoprazole (PREVACID) 30 MG capsule Take 1 capsule (30 mg total) by mouth daily at  12 noon. (Patient not taking: Reported on 06/12/2021) 30 capsule 0   omeprazole (PRILOSEC) 10 MG capsule Take 10 mg by mouth daily. (Patient not taking: Reported on 06/12/2021)     No facility-administered medications prior to visit.    Allergies  Allergen Reactions   Penicillins Anxiety and Anaphylaxis    Has patient had a PCN reaction causing immediate rash, facial/tongue/throat swelling, SOB or lightheadedness with hypotension: N Has patient had a PCN reaction causing severe rash involving mucus membranes or skin necrosis: N Has patient had a PCN reaction that required hospitalization: Y Has patient had a PCN reaction occurring within the last 10 years: Y If all of the above answers are "NO", then may proceed with Cephalosporin use.  Other reaction(s): unknown, Unknown, Wheezing Has patient had a PCN reaction causing immediate rash, facial/tongue/throat swelling, SOB or lightheadedness with hypotension: N Has patient had a PCN reaction causing severe rash involving mucus membranes or skin necrosis: N Has patient had a PCN reaction that required hospitalization: Y Has patient had a PCN reaction occurring within the last 10 years: Y If all of the above answers are "NO", then may proceed with Cephalosporin use.    Amoxil [Amoxicillin]     Review of Systems  Constitutional:  Positive for malaise/fatigue. Negative for chills and fever.  HENT:  Positive for congestion and sinus pain. Negative for ear discharge, ear pain, hearing loss, nosebleeds, sore throat and tinnitus.   Eyes: Negative.   Respiratory:  Negative for cough, hemoptysis, sputum production, shortness of breath, wheezing and stridor.   Cardiovascular:  Negative for chest pain, palpitations and leg swelling.  Gastrointestinal:  Positive for heartburn. Negative for abdominal pain, blood in stool, constipation, diarrhea, nausea and vomiting.  Genitourinary: Negative.   Musculoskeletal:        See HPI  Skin: Negative.    Neurological: Negative.   Psychiatric/Behavioral:  Negative for depression. The patient is not nervous/anxious.   All other systems reviewed and are negative.     Objective:    Physical Exam Vitals reviewed.  Constitutional:      General: She is not in acute distress.    Appearance: Normal appearance. She is normal weight.  HENT:     Head: Normocephalic.     Right Ear: Tympanic membrane, ear canal and external ear normal.     Left Ear: Tympanic membrane, ear canal and external ear normal.     Nose: Nose normal. No congestion or rhinorrhea.     Mouth/Throat:     Mouth: Mucous membranes are moist.     Pharynx: Oropharynx is clear.  Eyes:  Extraocular Movements: Extraocular movements intact.     Conjunctiva/sclera: Conjunctivae normal.     Pupils: Pupils are equal, round, and reactive to light.  Cardiovascular:     Rate and Rhythm: Normal rate and regular rhythm.     Pulses: Normal pulses.     Heart sounds: Normal heart sounds.     Comments: No obvious peripheral edema Pulmonary:     Effort: Pulmonary effort is normal.     Breath sounds: Normal breath sounds.  Abdominal:     General: Abdomen is flat. Bowel sounds are normal. There is no distension.     Palpations: Abdomen is soft. There is no mass.     Tenderness: There is no abdominal tenderness. There is no right CVA tenderness, left CVA tenderness, guarding or rebound.     Hernia: No hernia is present.  Musculoskeletal:        General: No swelling, tenderness, deformity or signs of injury. Normal range of motion.     Cervical back: Normal range of motion and neck supple.     Right lower leg: No edema.     Left lower leg: No edema.  Skin:    General: Skin is warm and dry.     Capillary Refill: Capillary refill takes less than 2 seconds.  Neurological:     General: No focal deficit present.     Mental Status: She is alert and oriented to person, place, and time.  Psychiatric:        Mood and Affect: Mood normal.         Behavior: Behavior normal.        Thought Content: Thought content normal.        Judgment: Judgment normal.    BP 101/60   Pulse (!) 103   Temp 98.3 F (36.8 C)   Ht _0  (1.6 m)   Wt 137 lb 2 oz (62.2 kg)   LMP 06/22/2021 (Exact Date)   SpO2 99%   BMI 24.29 kg/m  Wt Readings from Last 3 Encounters:  07/13/21 137 lb 2 oz (62.2 kg)  07/02/21 135 lb (61.2 kg)  06/23/21 135 lb (61.2 kg)     There is no immunization history on file for this patient.  Diabetic Foot Exam - Simple   No data filed     Lab Results  Component Value Date   TSH 0.839 05/28/2021   Lab Results  Component Value Date   WBC 8.4 07/13/2021   HGB 8.9 (L) 07/13/2021   HCT 30.9 (L) 07/13/2021   MCV 71 (L) 07/13/2021   PLT 323 07/13/2021   Lab Results  Component Value Date   NA 136 07/02/2021   K 3.0 (L) 07/02/2021   CO2 23 07/02/2021   GLUCOSE 103 (H) 07/02/2021   BUN 10 07/02/2021   CREATININE 0.66 07/02/2021   BILITOT 0.3 07/02/2021   ALKPHOS 62 07/02/2021   AST 19 07/02/2021   ALT 10 07/02/2021   PROT 7.4 07/02/2021   ALBUMIN 3.7 07/02/2021   CALCIUM 8.5 (L) 07/02/2021   ANIONGAP 8 07/02/2021   EGFR 119 05/28/2021   Lab Results  Component Value Date   CHOL 157 05/28/2021   Lab Results  Component Value Date   HDL 55 05/28/2021   Lab Results  Component Value Date   LDLCALC 94 05/28/2021   Lab Results  Component Value Date   TRIG 32 05/28/2021   Lab Results  Component Value Date   CHOLHDL 2.9 05/28/2021   Lab  Results  Component Value Date   HGBA1C 5.6 05/28/2021       Assessment & Plan:   Problem List Items Addressed This Visit   None Visit Diagnoses     Iron deficiency anemia, unspecified iron deficiency anemia type    -  Primary   Relevant Orders   CBC with Differential/Platelet (Completed)   POCT urine pregnancy (Completed)   Iron, TIBC and Ferritin Panel (Completed)  Patient informed to continue taking prescribe iron supplement   Flu-like symptoms        Relevant Orders   Influenza A/B (Completed): negative   Acute non-recurrent sinusitis of other sinus       Relevant Medications   doxycycline (VIBRAMYCIN) 50 MG/5ML SYRP   cetirizine (ZYRTEC) 10 MG chewable tablet   fluticasone (FLONASE) 50 MCG/ACT nasal spray Informed to take OTC medications as needed to help with symptom management    Gastroesophageal reflux disease, unspecified whether esophagitis present       Relevant Medications   pantoprazole (PROTONIX) 40 MG tablet, add to treatment regimen Discussed diet options  Informed to take OTC medications as needed for symptoms management    Follow up in 2 wks for reevaluation of anemia, sooner as needed    I have discontinued Pema Bright's omeprazole. I am also having her start on pantoprazole, doxycycline, cetirizine, and fluticasone. Additionally, I am having her maintain her multivitamin, magnesium, EPINEPHrine, LORazepam, docusate sodium, ferrous sulfate, ondansetron, and sucralfate.  Meds ordered this encounter  Medications   pantoprazole (PROTONIX) 40 MG tablet    Sig: Take 1 tablet (40 mg total) by mouth daily.    Dispense:  30 tablet    Refill:  3   doxycycline (VIBRAMYCIN) 50 MG/5ML SYRP    Sig: Take 10 mLs (100 mg total) by mouth 2 (two) times daily for 7 days.    Dispense:  200 mL    Refill:  0   cetirizine (ZYRTEC) 10 MG chewable tablet    Sig: Chew 1 tablet (10 mg total) by mouth daily.    Dispense:  30 tablet    Refill:  2   fluticasone (FLONASE) 50 MCG/ACT nasal spray    Sig: Place 2 sprays into both nostrils daily.    Dispense:  16 g    Refill:  0     Teena Dunk, NP

## 2021-07-17 LAB — CBC WITH DIFFERENTIAL/PLATELET

## 2021-07-17 LAB — IRON,TIBC AND FERRITIN PANEL

## 2021-07-18 ENCOUNTER — Emergency Department (HOSPITAL_BASED_OUTPATIENT_CLINIC_OR_DEPARTMENT_OTHER)
Admission: EM | Admit: 2021-07-18 | Discharge: 2021-07-18 | Disposition: A | Discharge status: Home / Self Care | Payer: Medicaid Other | Attending: Emergency Medicine | Admitting: Emergency Medicine

## 2021-07-18 ENCOUNTER — Encounter (HOSPITAL_BASED_OUTPATIENT_CLINIC_OR_DEPARTMENT_OTHER): Payer: Self-pay

## 2021-07-18 ENCOUNTER — Emergency Department (HOSPITAL_BASED_OUTPATIENT_CLINIC_OR_DEPARTMENT_OTHER): Payer: Medicaid Other

## 2021-07-18 ENCOUNTER — Other Ambulatory Visit: Payer: Self-pay

## 2021-07-18 DIAGNOSIS — Y92410 Unspecified street and highway as the place of occurrence of the external cause: Secondary | ICD-10-CM | POA: Insufficient documentation

## 2021-07-18 DIAGNOSIS — K219 Gastro-esophageal reflux disease without esophagitis: Secondary | ICD-10-CM | POA: Insufficient documentation

## 2021-07-18 DIAGNOSIS — R109 Unspecified abdominal pain: Secondary | ICD-10-CM | POA: Diagnosis present

## 2021-07-18 DIAGNOSIS — M546 Pain in thoracic spine: Secondary | ICD-10-CM | POA: Diagnosis not present

## 2021-07-18 DIAGNOSIS — M7918 Myalgia, other site: Secondary | ICD-10-CM

## 2021-07-18 HISTORY — DX: Gastro-esophageal reflux disease without esophagitis: K21.9

## 2021-07-18 MED ORDER — PANTOPRAZOLE SODIUM 40 MG PO TBEC
40.0000 mg | DELAYED_RELEASE_TABLET | Freq: Once | ORAL | Status: AC
  Administered 2021-07-18: 40 mg via ORAL
  Filled 2021-07-18: qty 1

## 2021-07-18 MED ORDER — LIDOCAINE VISCOUS HCL 2 % MT SOLN
15.0000 mL | Freq: Once | OROMUCOSAL | Status: AC
  Administered 2021-07-18: 15 mL via ORAL
  Filled 2021-07-18: qty 15

## 2021-07-18 MED ORDER — HYDROXYZINE HCL 25 MG PO TABS
25.0000 mg | ORAL_TABLET | Freq: Once | ORAL | Status: DC
  Filled 2021-07-18: qty 1

## 2021-07-18 MED ORDER — ALUM & MAG HYDROXIDE-SIMETH 200-200-20 MG/5ML PO SUSP
30.0000 mL | Freq: Once | ORAL | Status: AC
  Administered 2021-07-18: 30 mL via ORAL
  Filled 2021-07-18: qty 30

## 2021-07-18 MED ORDER — PANTOPRAZOLE SODIUM 20 MG PO TBEC: 20.0000 mg | DELAYED_RELEASE_TABLET | Freq: Once | ORAL | Status: DC
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
## 2021-07-18 NOTE — ED Notes (Signed)
Pt returned from x-ray via stretcher by rad tech.
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
## 2021-07-18 NOTE — Discharge Instructions (Addendum)
Take your medication as prescribed by your doctor.  Recheck with your doctor next week.

## 2021-07-22 ENCOUNTER — Ambulatory Visit: Payer: Medicaid Other | Admitting: Clinical

## 2021-07-28 ENCOUNTER — Other Ambulatory Visit: Payer: Medicaid Other

## 2021-07-28 ENCOUNTER — Ambulatory Visit: Payer: Medicaid Other | Admitting: Nurse Practitioner

## 2021-08-12 ENCOUNTER — Encounter: Payer: Self-pay | Admitting: Nurse Practitioner

## 2021-08-12 ENCOUNTER — Other Ambulatory Visit: Payer: Self-pay

## 2021-08-12 ENCOUNTER — Ambulatory Visit: Payer: Medicaid Other | Admitting: Nurse Practitioner

## 2021-08-12 VITALS — BP 103/69 | HR 99 | Temp 98.0°F | Ht 63.0 in | Wt 136.4 lb

## 2021-08-12 DIAGNOSIS — D508 Other iron deficiency anemias: Secondary | ICD-10-CM

## 2021-08-12 DIAGNOSIS — Z0000 Encounter for general adult medical examination without abnormal findings: Secondary | ICD-10-CM

## 2021-08-12 NOTE — Patient Instructions (Signed)
You were seen today in the [facility] for follow up of anemia and anxiety. Labs were collected, results will be available via MyChart or, if abnormal, you will be contacted by clinic staff. You were prescribed medications, please take as directed. Please follow up in 6 mths for reevaluation of symptoms.

## 2021-08-12 NOTE — Progress Notes (Signed)
[address], [place]  [address] Phone:  [phone]   Fax:  [phone] Subjective:   Patient ID: Kristi Shelton, female    DOB: 10-26-1990, 30 y.o.   MRN: [id number]  Chief Complaint  Patient presents with   Follow-up    2 month follow up; Anxiety/anemia/insomnia Pt states that she has been doing pretty good and has had no hospital visits since her last appt.  Pt states that she has been having numbness and tingling sensations in her both hands and feet x 2 days.    HPI Kristi Shelton 31 y.o. female  has a past medical history of Anemia, GERD (gastroesophageal reflux disease), and Tonsil stone.  To the [facility]  for reevaluation of anxiety, anemia and acid reflux  Patient states that acid reflux has improved since taking prescribed medications. States that when she has flares she takes prescribed medications and flare resolves within 30 minutes.  Denies any issues with anxiety at this time, but does endorse increased stress, which has impacted menstrual cycle. States that she only had cycle for 1 day last month. Has been compliant with all medications, including iron supplement. Continues to have intermittent fatigue and difficulty with eating habits. States that she continues to drink Ensure as meal replacement.  Concerned about intermittent numbness in bilateral hands and feet that began recently, suspects its related to dehydration. Denies any weakness. Denies any recent trauma or injury. Denies any other complaints today.   Past Medical History:  Diagnosis Date   Anemia    GERD (gastroesophageal reflux disease)    Tonsil stone     Past Surgical History:  Procedure Laterality Date   CESAREAN SECTION     TOOTH EXTRACTION     TUBAL LIGATION     WISDOM TOOTH EXTRACTION      Family History  Problem Relation Age of Onset   Hypertension Mother    Cancer Mother    Diabetes Maternal Grandmother    Cancer Other    Diabetes Other     Hypertension Other     Social History   Socioeconomic History   Marital status: Single    Spouse name: Not on file   Number of children: Not on file   Years of education: Not on file   Highest education level: Not on file  Occupational History   Not on file  Tobacco Use   Smoking status: Never   Smokeless tobacco: Never  Vaping Use   Vaping Use: Never used  Substance and Sexual Activity   Alcohol use: Yes    Comment: occ   Drug use: No   Sexual activity: Yes    Birth control/protection: None  Other Topics Concern   Not on file  Social History Narrative   Not on file   Social Determinants of Health   Financial Resource Strain: Not on file  Food Insecurity: Not on file  Transportation Needs: Not on file  Physical Activity: Not on file  Stress: Not on file  Social Connections: Not on file  Intimate Partner Violence: Not on file    Outpatient Medications Prior to Visit  Medication Sig Dispense Refill   cetirizine (ZYRTEC) 10 MG chewable tablet Chew 1 tablet (10 mg total) by mouth daily. 30 tablet 2   docusate sodium (COLACE) 100 MG capsule Take 1 capsule (100 mg total) by mouth 2 (two) times daily as needed for mild constipation or moderate constipation. (Patient not taking: Reported on 07/13/2021) 30  capsule 0   EPINEPHrine 0.3 mg/0.3 mL IJ SOAJ injection Inject 0.3 mg into the muscle as needed for anaphylaxis. 1 each 0   ferrous sulfate 300 (60 Fe) MG/5ML syrup Take 5 mLs (300 mg total) by mouth daily. 150 mL 3   fluticasone (FLONASE) 50 MCG/ACT nasal spray Place 2 sprays into both nostrils daily. 16 g 0   Multiple Vitamin (MULTIVITAMIN) tablet Take 1 tablet by mouth daily.     pantoprazole (PROTONIX) 40 MG tablet Take 1 tablet (40 mg total) by mouth daily. 30 tablet 3   sucralfate (CARAFATE) 1 g tablet Take 1 tablet (1 g total) by mouth 4 (four) times daily -  with meals and at bedtime. 90 tablet 0   LORazepam (ATIVAN) 0.5 MG tablet Take 1 tablet (0.5 mg total) by  mouth 2 (two) times daily as needed for anxiety. (Patient not taking: Reported on 07/13/2021) 15 tablet 1   magnesium 30 MG tablet Take 30 mg by mouth 2 (two) times daily. (Patient not taking: Reported on 06/12/2021)     ondansetron (ZOFRAN) 4 MG tablet Take 1 tablet (4 mg total) by mouth every 6 (six) hours. (Patient not taking: Reported on 07/13/2021) 12 tablet 0   No facility-administered medications prior to visit.    Allergies  Allergen Reactions   Penicillins Anxiety and Anaphylaxis    Has patient had a PCN reaction causing immediate rash, facial/tongue/throat swelling, SOB or lightheadedness with hypotension: N Has patient had a PCN reaction causing severe rash involving mucus membranes or skin necrosis: N Has patient had a PCN reaction that required hospitalization: Y Has patient had a PCN reaction occurring within the last 10 years: Y If all of the above answers are "NO", then may proceed with Cephalosporin use.  Other reaction(s): unknown, Unknown, Wheezing Has patient had a PCN reaction causing immediate rash, facial/tongue/throat swelling, SOB or lightheadedness with hypotension: N Has patient had a PCN reaction causing severe rash involving mucus membranes or skin necrosis: N Has patient had a PCN reaction that required hospitalization: Y Has patient had a PCN reaction occurring within the last 10 years: Y If all of the above answers are "NO", then may proceed with Cephalosporin use.    Amoxil [Amoxicillin]     Review of Systems  Constitutional:  Negative for chills, fever and malaise/fatigue.  Respiratory:  Negative for cough and shortness of breath.   Cardiovascular:  Negative for chest pain, palpitations and leg swelling.  Gastrointestinal:  Positive for heartburn. Negative for abdominal pain, blood in stool, constipation, diarrhea, nausea and vomiting.  Musculoskeletal: Negative.   Skin: Negative.   Neurological:  Positive for tingling and sensory change. Negative for  dizziness, tremors, speech change, focal weakness, seizures, loss of consciousness, weakness and headaches.  Psychiatric/Behavioral:  Negative for depression. The patient is nervous/anxious.   All other systems reviewed and are negative.     Objective:    Physical Exam Vitals reviewed.  Constitutional:      General: She is not in acute distress.    Appearance: Normal appearance. She is normal weight.  HENT:     Head: Normocephalic.     Mouth/Throat:     Mouth: Mucous membranes are moist.     Pharynx: Oropharynx is clear.  Cardiovascular:     Rate and Rhythm: Normal rate and regular rhythm.     Pulses: Normal pulses.     Heart sounds: Normal heart sounds.     Comments: No obvious peripheral edema Pulmonary:  Effort: Pulmonary effort is normal.     Breath sounds: Normal breath sounds.  Musculoskeletal:        General: No swelling, tenderness, deformity or signs of injury. Normal range of motion.     Cervical back: Normal range of motion and neck supple.     Right lower leg: No edema.     Left lower leg: No edema.  Skin:    General: Skin is warm and dry.     Capillary Refill: Capillary refill takes less than 2 seconds.  Neurological:     General: No focal deficit present.     Mental Status: She is alert and oriented to person, place, and time.     Cranial Nerves: No cranial nerve deficit.     Sensory: No sensory deficit.     Motor: No weakness.     Coordination: Coordination normal.     Gait: Gait normal.     Deep Tendon Reflexes: Reflexes normal.  Psychiatric:        Mood and Affect: Mood normal.        Behavior: Behavior normal.        Thought Content: Thought content normal.        Judgment: Judgment normal.    BP 103/69    Pulse 99    Temp 98 F (36.7 C)    Ht _0  (1.6 m)    Wt 136 lb 6.4 oz (61.9 kg)    LMP 07/18/2021 (Exact Date)    SpO2 96%    BMI 24.16 kg/m  Wt Readings from Last 3 Encounters:  08/12/21 136 lb 6.4 oz (61.9 kg)  07/18/21 138 lb (62.6 kg)   07/13/21 137 lb 2 oz (62.2 kg)     There is no immunization history on file for this patient.  Diabetic Foot Exam - Simple   No data filed     Lab Results  Component Value Date   TSH 0.839 05/28/2021   Lab Results  Component Value Date   WBC CANCELED 07/13/2021   HGB CANCELED 07/13/2021   HCT CANCELED 07/13/2021   MCV 71 (L) 07/13/2021   PLT CANCELED 07/13/2021   Lab Results  Component Value Date   NA 136 07/02/2021   K 3.0 (L) 07/02/2021   CO2 23 07/02/2021   GLUCOSE 103 (H) 07/02/2021   BUN 10 07/02/2021   CREATININE 0.66 07/02/2021   BILITOT 0.3 07/02/2021   ALKPHOS 62 07/02/2021   AST 19 07/02/2021   ALT 10 07/02/2021   PROT 7.4 07/02/2021   ALBUMIN 3.7 07/02/2021   CALCIUM 8.5 (L) 07/02/2021   ANIONGAP 8 07/02/2021   EGFR 119 05/28/2021   Lab Results  Component Value Date   CHOL 157 05/28/2021   Lab Results  Component Value Date   HDL 55 05/28/2021   Lab Results  Component Value Date   LDLCALC 94 05/28/2021   Lab Results  Component Value Date   TRIG 32 05/28/2021   Lab Results  Component Value Date   CHOLHDL 2.9 05/28/2021   Lab Results  Component Value Date   HGBA1C 5.6 05/28/2021       Assessment & Plan:   Problem List Items Addressed This Visit   None Visit Diagnoses     Other iron deficiency anemia    -  Primary   Relevant Orders   CBC with Differential Informed to continue taking iron supplements as prescribed Discussed diet regimen at length    Healthcare maintenance  Educated on appropriate hydration Informed to continue taking GERD medications as prescribed Discussed methods for managing stressors and anxiety at home    Follow up in 6 mths for reevaluation of symptoms and wellness visit, sooner as needed    I am having Larena Sox maintain her multivitamin, magnesium, EPINEPHrine, LORazepam, docusate sodium, ferrous sulfate, ondansetron, sucralfate, pantoprazole, cetirizine, and fluticasone.  No orders of  the defined types were placed in this encounter.    Teena Dunk, NP

## 2021-08-13 LAB — CBC WITH DIFFERENTIAL/PLATELET
Basophils Absolute: 0 10*3/uL (ref 0.0–0.2)
Basos: 1 %
EOS (ABSOLUTE): 0.1 10*3/uL (ref 0.0–0.4)
Eos: 1 %
Hematocrit: 35 % (ref 34.0–46.6)
Hemoglobin: 10.7 g/dL — ABNORMAL LOW (ref 11.1–15.9)
Immature Grans (Abs): 0 10*3/uL (ref 0.0–0.1)
Immature Granulocytes: 0 %
Lymphocytes Absolute: 2 10*3/uL (ref 0.7–3.1)
Lymphs: 33 %
MCH: 22.8 pg — ABNORMAL LOW (ref 26.6–33.0)
MCHC: 30.6 g/dL — ABNORMAL LOW (ref 31.5–35.7)
MCV: 75 fL — ABNORMAL LOW (ref 79–97)
Monocytes Absolute: 0.4 10*3/uL (ref 0.1–0.9)
Monocytes: 7 %
Neutrophils Absolute: 3.5 10*3/uL (ref 1.4–7.0)
Neutrophils: 58 %
Platelets: 408 10*3/uL (ref 150–450)
RBC: 4.69 x10E6/uL (ref 3.77–5.28)
RDW: 22.4 % — ABNORMAL HIGH (ref 11.7–15.4)
WBC: 6 10*3/uL (ref 3.4–10.8)

## 2021-08-17 ENCOUNTER — Other Ambulatory Visit: Payer: Self-pay

## 2021-08-17 ENCOUNTER — Encounter (HOSPITAL_BASED_OUTPATIENT_CLINIC_OR_DEPARTMENT_OTHER): Payer: Self-pay

## 2021-08-17 ENCOUNTER — Emergency Department (HOSPITAL_BASED_OUTPATIENT_CLINIC_OR_DEPARTMENT_OTHER)
Admission: EM | Admit: 2021-08-17 | Discharge: 2021-08-17 | Disposition: A | Discharge status: Home / Self Care | Payer: Medicaid Other | Attending: Emergency Medicine | Admitting: Emergency Medicine

## 2021-08-17 DIAGNOSIS — D649 Anemia, unspecified: Secondary | ICD-10-CM | POA: Insufficient documentation

## 2021-08-17 DIAGNOSIS — R197 Diarrhea, unspecified: Secondary | ICD-10-CM

## 2021-08-17 LAB — COMPREHENSIVE METABOLIC PANEL
ALT: 11 U/L (ref 0–44)
AST: 23 U/L (ref 15–41)
Albumin: 3.9 g/dL (ref 3.5–5.0)
Alkaline Phosphatase: 62 U/L (ref 38–126)
Anion gap: 8 (ref 5–15)
BUN: 11 mg/dL (ref 6–20)
CO2: 22 mmol/L (ref 22–32)
Calcium: 9.2 mg/dL (ref 8.9–10.3)
Chloride: 106 mmol/L (ref 98–111)
Creatinine, Ser: 0.67 mg/dL (ref 0.44–1.00)
GFR, Estimated: >60 mL/min (ref >60)
Glucose, Bld: 99 mg/dL (ref 70–99)
Potassium: 4.1 mmol/L (ref 3.5–5.1)
Sodium: 136 mmol/L (ref 135–145)
Total Bilirubin: 0.5 mg/dL (ref 0.3–1.2)
Total Protein: 7.7 g/dL (ref 6.5–8.1)

## 2021-08-17 LAB — CBC WITH DIFFERENTIAL/PLATELET
Abs Immature Granulocytes: 0.01 10*3/uL (ref 0.00–0.07)
Basophils Absolute: 0 10*3/uL (ref 0.0–0.1)
Basophils Relative: 1 %
Eosinophils Absolute: 0.1 10*3/uL (ref 0.0–0.5)
Eosinophils Relative: 1 %
HCT: 31.8 % — ABNORMAL LOW (ref 36.0–46.0)
Hemoglobin: 9.9 g/dL — ABNORMAL LOW (ref 12.0–15.0)
Immature Granulocytes: 0 %
Lymphocytes Relative: 28 %
Lymphs Abs: 1.8 10*3/uL (ref 0.7–4.0)
MCH: 23.6 pg — ABNORMAL LOW (ref 26.0–34.0)
MCHC: 31.1 g/dL (ref 30.0–36.0)
MCV: 75.7 fL — ABNORMAL LOW (ref 80.0–100.0)
Monocytes Absolute: 0.4 10*3/uL (ref 0.1–1.0)
Monocytes Relative: 6 %
Neutro Abs: 4.3 10*3/uL (ref 1.7–7.7)
Neutrophils Relative %: 64 %
Platelets: 369 10*3/uL (ref 150–400)
RBC: 4.2 MIL/uL (ref 3.87–5.11)
RDW: 22.6 % — ABNORMAL HIGH (ref 11.5–15.5)
WBC: 6.6 10*3/uL (ref 4.0–10.5)
nRBC: 0 % (ref 0.0–0.2)

## 2021-08-17 MED ORDER — SODIUM CHLORIDE 0.9 % IV BOLUS
1000.0000 mL | Freq: Once | INTRAVENOUS | Status: AC
  Administered 2021-08-17: 1000 mL via INTRAVENOUS

## 2021-08-17 NOTE — ED Provider Notes (Signed)
[facility] EMERGENC[facility] DEPARTMENT Provider Note   CSN: [id number] Arrival date & time: 08/17/21  [id number]     History  Chief Complaint  Patient presents with   Diarrhea    Kristi Shelton is a 31 y.o. female.  Patient is a 31 year old female who presents with loose stools.  On chart review, she has a history of anemia, GERD and anxiety.  She says that she has been told that she is dehydrated recently.  She says the diarrhea only started yesterday and she has had 3 episodes of loose stools.  They are not watery or bloody.  No associated nausea or vomiting.  No abdominal pain.  She says that she does not drink much water because she does not like the taste of it and the ones that she likes are very expensive.  She denies any fevers.  No cough or cold symptoms.  No other recent illnesses.      Home Medications Prior to Admission medications   Medication Sig Start Date End Date Taking? Authorizing Provider  docusate sodium (COLACE) 100 MG capsule Take 1 capsule (100 mg total) by mouth 2 (two) times daily as needed for mild constipation or moderate constipation. Patient not taking: Reported on 07/13/2021 06/15/21   Bo Merino I, NP  cetirizine (ZYRTEC) 10 MG chewable tablet Chew 1 tablet (10 mg total) by mouth daily. 07/13/21 10/11/21  Bo Merino I, NP  EPINEPHrine 0.3 mg/0.3 mL IJ SOAJ injection Inject 0.3 mg into the muscle as needed for anaphylaxis. 06/07/21   Loni Beckwith, PA-C  ferrous sulfate 300 (60 Fe) MG/5ML syrup Take 5 mLs (300 mg total) by mouth daily. 06/16/21 10/14/21  Passmore, Jake Church I, NP  fluticasone (FLONASE) 50 MCG/ACT nasal spray Place 2 sprays into both nostrils daily. 07/13/21   Passmore, Jake Church I, NP  LORazepam (ATIVAN) 0.5 MG tablet Take 1 tablet (0.5 mg total) by mouth 2 (two) times daily as needed for anxiety. Patient not taking: Reported on 07/13/2021 06/12/21   Bo Merino I, NP  magnesium 30 MG tablet Take 30 mg by mouth 2 (two) times  daily. Patient not taking: Reported on 06/12/2021    [provider]  Multiple Vitamin (MULTIVITAMIN) tablet Take 1 tablet by mouth daily.    [provider]  ondansetron (ZOFRAN) 4 MG tablet Take 1 tablet (4 mg total) by mouth every 6 (six) hours. Patient not taking: Reported on 07/13/2021 06/23/21   Lennice Sites, DO  pantoprazole (PROTONIX) 40 MG tablet Take 1 tablet (40 mg total) by mouth daily. 07/13/21   Passmore, Jake Church I, NP  sucralfate (CARAFATE) 1 g tablet Take 1 tablet (1 g total) by mouth 4 (four) times daily -  with meals and at bedtime. 07/02/21   Veryl Speak, MD      Allergies    Penicillins and Amoxil [amoxicillin]    Review of Systems   Review of Systems  Constitutional:  Negative for chills, diaphoresis, fatigue and fever.  HENT:  Negative for congestion, rhinorrhea and sneezing.   Eyes: Negative.   Respiratory:  Negative for cough, chest tightness and shortness of breath.   Cardiovascular:  Negative for chest pain and leg swelling.  Gastrointestinal:  Positive for diarrhea. Negative for abdominal pain, blood in stool, nausea and vomiting.  Genitourinary:  Negative for difficulty urinating, flank pain, frequency and hematuria.  Musculoskeletal:  Negative for arthralgias and back pain.  Skin:  Negative for rash.  Neurological:  Negative for dizziness, speech difficulty, weakness, numbness and  headaches.   Physical Exam Updated Vital Signs BP 101/73    Pulse 81    Temp 97.7 F (36.5 C) (Oral)    Resp 18    Ht 5\' 3"  (1.6 m)    Wt 62.6 kg    LMP 08/17/2021 (Exact Date)    SpO2 100%    BMI 24.45 kg/m  Physical Exam Constitutional:      Appearance: She is well-developed.  HENT:     Head: Normocephalic and atraumatic.  Eyes:     Pupils: Pupils are equal, round, and reactive to light.  Cardiovascular:     Rate and Rhythm: Normal rate and regular rhythm.     Heart sounds: Normal heart sounds.  Pulmonary:     Effort: Pulmonary effort is normal. No  respiratory distress.     Breath sounds: Normal breath sounds. No wheezing or rales.  Chest:     Chest wall: No tenderness.  Abdominal:     General: Bowel sounds are normal.     Palpations: Abdomen is soft.     Tenderness: There is no abdominal tenderness. There is no guarding or rebound.  Musculoskeletal:        General: Normal range of motion.     Cervical back: Normal range of motion and neck supple.  Lymphadenopathy:     Cervical: No cervical adenopathy.  Skin:    General: Skin is warm and dry.     Findings: No rash.  Neurological:     Mental Status: She is alert and oriented to person, place, and time.    ED Results / Procedures / Treatments   Labs (all labs ordered are listed, but only abnormal results are displayed) Labs Reviewed  CBC WITH DIFFERENTIAL/PLATELET - Abnormal; Notable for the following components:      Result Value   Hemoglobin 9.9 (*)    HCT 31.8 (*)    MCV 75.7 (*)    MCH 23.6 (*)    RDW 22.6 (*)    All other components within normal limits  COMPREHENSIVE METABOLIC PANEL    EKG None  Radiology No results found.  Procedures Procedures    Medications Ordered in ED Medications  sodium chloride 0.9 % bolus 1,000 mL ( Intravenous Stopped 08/17/21 1233)    ED Course/ Medical Decision Making/ A&P                           Medical Decision Making  Patient is a 31 year old female who presents with 3 loose stools since yesterday.  There is no associate abdominal pain.  No nausea or vomiting.  Her vital signs are stable.  No fevers.  No urinary symptoms.  No suggestions of an abdominal infection.  Her labs are nonconcerning.  These were reviewed by me.  Her hemoglobin is low.  However I compared this to values in her old chart and similar to prior values.  She was given a liter of normal saline.  She was discharged home in good condition.  Symptomatic care instructions were given.  She was encouraged to follow-up with her PCP.  Return precautions were  given.  Final Clinical Impression(s) / ED Diagnoses Final diagnoses:  Diarrhea, unspecified type  Anemia, unspecified type    Rx / DC Orders ED Discharge Orders     None         Malvin Johns, MD 08/17/21 1318

## 2021-08-17 NOTE — ED Triage Notes (Signed)
Pt arrives with reports of diarrhea starting last night reports she was seen by a doctor on the 4th of this month and told she was dehydrated. States they advised her to drink more water which she has not been doing because 'i don't really like water'. Reports 3 episodes of diarrhea last night. States she ate fried chicken yesterday. Has been drinking ensures daily

## 2021-08-17 NOTE — Discharge Instructions (Signed)
Make sure that you are drinking plenty of water to stay hydrated.  Follow-up with your primary care doctor for recheck.  Return to emergency room if you have any worsening symptoms.

## 2021-09-18 ENCOUNTER — Ambulatory Visit: Payer: Self-pay | Admitting: Nurse Practitioner

## 2021-09-22 ENCOUNTER — Ambulatory Visit: Payer: Self-pay | Admitting: Nurse Practitioner

## 2021-10-20 ENCOUNTER — Other Ambulatory Visit: Payer: Self-pay

## 2021-11-10 ENCOUNTER — Other Ambulatory Visit: Payer: Self-pay

## 2021-11-10 ENCOUNTER — Emergency Department (HOSPITAL_BASED_OUTPATIENT_CLINIC_OR_DEPARTMENT_OTHER)
Admission: EM | Admit: 2021-11-10 | Discharge: 2021-11-10 | Disposition: A | Discharge status: Home / Self Care | Payer: Medicaid Other | Attending: Emergency Medicine | Admitting: Emergency Medicine

## 2021-11-10 DIAGNOSIS — N898 Other specified noninflammatory disorders of vagina: Secondary | ICD-10-CM

## 2021-11-10 LAB — PREGNANCY, URINE: Preg Test, Ur: NEGATIVE

## 2021-11-10 LAB — URINALYSIS, ROUTINE W REFLEX MICROSCOPIC
Bilirubin Urine: NEGATIVE
Glucose, UA: NEGATIVE mg/dL
Hgb urine dipstick: NEGATIVE
Ketones, ur: NEGATIVE mg/dL
Leukocytes,Ua: NEGATIVE
Nitrite: NEGATIVE
Protein, ur: NEGATIVE mg/dL
Specific Gravity, Urine: >=1.03 (ref 1.005–1.030)
pH: 6 (ref 5.0–8.0)

## 2021-11-10 LAB — WET PREP, GENITAL
Clue Cells Wet Prep HPF POC: NONE SEEN
Sperm: NONE SEEN
Trich, Wet Prep: NONE SEEN
WBC, Wet Prep HPF POC: <10 (ref <10)
Yeast Wet Prep HPF POC: NONE SEEN

## 2021-11-10 MED ORDER — GENTAMICIN SULFATE 40 MG/ML IJ SOLN
240.0000 mg | Freq: Once | INTRAMUSCULAR | Status: AC
  Administered 2021-11-10: 240 mg via INTRAMUSCULAR
  Filled 2021-11-10: qty 6

## 2021-11-10 MED ORDER — AZITHROMYCIN 250 MG PO TABS
1000.0000 mg | ORAL_TABLET | Freq: Once | ORAL | Status: AC
Start: 2021-11-10 — End: 2021-11-10
  Administered 2021-11-10: 1000 mg via ORAL
  Filled 2021-11-10: qty 4

## 2021-11-10 MED ORDER — DOXYCYCLINE HYCLATE 100 MG PO TABS
100.0000 mg | ORAL_TABLET | Freq: Once | ORAL | Status: AC
  Administered 2021-11-10: 100 mg via ORAL
  Filled 2021-11-10: qty 1

## 2021-11-10 MED ORDER — DOXYCYCLINE HYCLATE 100 MG PO CAPS
100.0000 mg | ORAL_CAPSULE | Freq: Two times a day (BID) | ORAL | 0 refills | Status: AC
Start: 2021-11-10 — End: 2021-11-17

## 2021-11-10 NOTE — ED Triage Notes (Signed)
Pt reports vaginal discharge and odor for one week ?

## 2021-11-10 NOTE — ED Provider Notes (Signed)
?[facility] EMERGENCY DEPARTMENT ?Provider Note ? ? ?CSN: [id number] ?Arrival date & time: 11/10/21  0845 ? ?  ? ?History ?PMH: Anemia ?Chief Complaint  ?Patient presents with  ? Vaginal Discharge  ? ? ?Kristi Shelton is a 30 y.o. female. ?Patient presents the emergency department with a chief complaint of vaginal discharge.  She says that she had an unprotected sex about 1 month ago with a person who is not her typical partner.  She says about a week ago she started noticing vaginal discharge that she describes as white and thick.  She says she had a vaginal odor that is new for her.  She says when the discharge dries it turned yellow.  She denies any itchiness, fevers, chills, or pelvic pain. ? ? ?Vaginal Discharge ?Associated symptoms: no abdominal pain and no fever   ? ?  ? ?Home Medications ?Prior to Admission medications   ?Medication Sig Start Date End Date Taking? Authorizing Provider  ?docusate sodium (COLACE) 100 MG capsule Take 1 capsule (100 mg total) by mouth 2 (two) times daily as needed for mild constipation or moderate constipation. ?Patient not taking: Reported on 07/13/2021 06/15/21   Bo Merino I, NP  ?doxycycline (VIBRAMYCIN) 100 MG capsule Take 1 capsule (100 mg total) by mouth 2 (two) times daily for 7 days. 11/10/21 11/17/21 Yes Amilyah Nack, Adora Fridge, PA-C  ?cetirizine (ZYRTEC) 10 MG chewable tablet Chew 1 tablet (10 mg total) by mouth daily. 07/13/21 10/11/21  Bo Merino I, NP  ?EPINEPHrine 0.3 mg/0.3 mL IJ SOAJ injection Inject 0.3 mg into the muscle as needed for anaphylaxis. 06/07/21   Loni Beckwith, PA-C  ?ferrous sulfate 300 (60 Fe) MG/5ML syrup Take 5 mLs (300 mg total) by mouth daily. 06/16/21 10/14/21  Bo Merino I, NP  ?fluticasone (FLONASE) 50 MCG/ACT nasal spray Place 2 sprays into both nostrils daily. 07/13/21   Bo Merino I, NP  ?LORazepam (ATIVAN) 0.5 MG tablet Take 1 tablet (0.5 mg total) by mouth 2 (two) times daily as needed for anxiety. ?Patient not  taking: Reported on 07/13/2021 06/12/21   Bo Merino I, NP  ?magnesium 30 MG tablet Take 30 mg by mouth 2 (two) times daily. ?Patient not taking: Reported on 06/12/2021    [provider]  ?Multiple Vitamin (MULTIVITAMIN) tablet Take 1 tablet by mouth daily.    [provider]  ?ondansetron (ZOFRAN) 4 MG tablet Take 1 tablet (4 mg total) by mouth every 6 (six) hours. ?Patient not taking: Reported on 07/13/2021 06/23/21   Lennice Sites, DO  ?pantoprazole (PROTONIX) 40 MG tablet Take 1 tablet (40 mg total) by mouth daily. 07/13/21   Bo Merino I, NP  ?sucralfate (CARAFATE) 1 g tablet Take 1 tablet (1 g total) by mouth 4 (four) times daily -  with meals and at bedtime. 07/02/21   Veryl Speak, MD  ?   ? ?Allergies    ?Penicillins and Amoxil [amoxicillin]   ? ?Review of Systems   ?Review of Systems  ?Constitutional:  Negative for chills and fever.  ?Gastrointestinal:  Negative for abdominal pain.  ?Genitourinary:  Positive for vaginal discharge. Negative for pelvic pain.  ?All other systems reviewed and are negative. ? ?Physical Exam ?Updated Vital Signs ?BP 96/73   Pulse 76   Temp 98.1 ?F (36.7 ?C)   Resp 16   Wt 63 kg   LMP 10/31/2021   SpO2 100%   BMI 24.62 kg/m?  ?Physical Exam ?Vitals and nursing note reviewed. Exam conducted with a  chaperone present.  ?Constitutional:   ?   General: She is not in acute distress. ?   Appearance: Normal appearance. She is well-developed. She is not ill-appearing, toxic-appearing or diaphoretic.  ?HENT:  ?   Head: Normocephalic and atraumatic.  ?   Nose: No nasal deformity.  ?   Mouth/Throat:  ?   Lips: Pink. No lesions.  ?Eyes:  ?   General: Gaze aligned appropriately. No scleral icterus.    ?   Right eye: No discharge.     ?   Left eye: No discharge.  ?   Conjunctiva/sclera: Conjunctivae normal.  ?   Right eye: Right conjunctiva is not injected. No exudate or hemorrhage. ?   Left eye: Left conjunctiva is not injected. No exudate or  hemorrhage. ?Pulmonary:  ?   Effort: Pulmonary effort is normal. No respiratory distress.  ?Abdominal:  ?   Tenderness: There is no abdominal tenderness. There is no guarding or rebound.  ?Genitourinary: ?   Exam position: Lithotomy position.  ?   Pubic Area: No rash.   ?   Labia:     ?   Right: No rash, tenderness, lesion or injury.     ?   Left: No rash, tenderness, lesion or injury.   ?   Vagina: Vaginal discharge present. No erythema or bleeding.  ?   Cervix: Cervical motion tenderness and discharge present. No erythema or cervical bleeding.  ?   Uterus: Normal.   ?   Adnexa: Right adnexa normal and left adnexa normal.    ?   Right: No mass or tenderness.      ?   Left: No mass or tenderness.    ?   Comments: Thick white discharge noted ?Skin: ?   General: Skin is warm and dry.  ?Neurological:  ?   Mental Status: She is alert and oriented to person, place, and time.  ?Psychiatric:     ?   Mood and Affect: Mood normal.     ?   Speech: Speech normal.     ?   Behavior: Behavior normal. Behavior is cooperative.  ? ? ?ED Results / Procedures / Treatments   ?Labs ?(all labs ordered are listed, but only abnormal results are displayed) ?Labs Reviewed  ?WET PREP, GENITAL  ?URINALYSIS, ROUTINE W REFLEX MICROSCOPIC  ?PREGNANCY, URINE  ?GC/CHLAMYDIA PROBE AMP ([place]) NOT AT [facility]  ? ? ?EKG ?None ? ?Radiology ?No results found. ? ?Procedures ?Procedures  ? ? ?Medications Ordered in ED ?Medications  ?gentamicin (GARAMYCIN) injection 240 mg (240 mg Intramuscular Given 11/10/21 0955)  ?azithromycin (ZITHROMAX) tablet 1,000 mg (1,000 mg Oral Given 11/10/21 0953)  ?doxycycline (VIBRA-TABS) tablet 100 mg (100 mg Oral Given 11/10/21 0952)  ? ? ?ED Course/ Medical Decision Making/ A&P ?Clinical Course as of 11/10/21 1018  ?Tue Nov 10, 2021  ?0913 Thick white discharge, yellow when it dries, + odor. No itchiness.  [GL]  ?  ?Clinical Course User Index ?[GL] Claus Silvestro, Adora Fridge, PA-C  ? ?                        ?Medical Decision  Making ?Amount and/or Complexity of Data Reviewed ?Labs: ordered. ? ?Risk ?Prescription drug management. ? ? ? ?MDM  ?This is a 31 y.o. female who presents to the ED with vaginal discharge ?The differential of this patient includes but is not limited to Gonorrhea, Chlamydia, Trichomonas, Bacterial Vaginosis, Yeast infection, PID ? ?My Impression, Plan,  and ED Course: Well appearing. Vitals Stable. Abdominal Exam unremarkable. Pelvic exam reveals thick white discharge. There is also cervical motion tenderness present. I obtained G/C, wet prep, UA, and U preg. ? ?I personally ordered, reviewed, and interpreted all laboratory work and imaging and agree with radiologist interpretation. Results interpreted below: Wet prep negative. Pregnancy negative, UA unremarkable. GC/Chlamydia pending ? ?Unremarkable workup. Will treat ppx for G/C given negative wet prep. She did have CMT, so likely cervicitis. Doubt PID with no pelvic pain. She has anaphylactic reaction to PCN. Will treat alternate regime. 240 IM Gentamicin x1, Azithromycin 1 g x1, and 7 days of Doxycycline. Return precautions provided.  ? ?Charting Requirements ?Additional history is obtained from:  Independent historian ?External Records from outside source obtained and reviewed including: Last seen 1/4 for IDA ?Social Determinants of Health:  none ?Pertinant PMH that complicates patient's illness: n/a ? ?Patient Care ?Problems that were addressed during this visit: ?- Vaginal Discharge: Acute illness with complication ?Medications given in ED: Gentamycin, Doxy, Azithro ?Reevaluation of the patient after these medicines showed that the patient stayed the same ?Disposition: abx, return precautions ? ?This is was an independent visit. My attending physician was immediately available if needed. ? ?Portions of this note were generated with [employer]. Dictation errors may occur despite best attempts at proofreading. ?  ? ?Final Clinical Impression(s) /  ED Diagnoses ?Final diagnoses:  ?Vaginal discharge  ? ? ?Rx / DC Orders ?ED Discharge Orders   ? ?      Ordered  ?  doxycycline (VIBRAMYCIN) 100 MG capsule  2 times daily       ? 11/10/21 0940  ? ?  ?  ? ?  ? ? ?  ?Jerney Baksh, Adora Fridge

## 2021-11-10 NOTE — Discharge Instructions (Addendum)
You tested negative for yeast infection, bacterial vaginosis, and trichomonas.  ?You have a pending gonorrhea/chlamydia test that should come back over the next two days. I have went ahead and treated you for these. You received two antibiotics here in the ED. Please take Doxycycline as prescribed for 7 days. Even if you test negative for gonorrhea and chlamydia, please finish antibiotic course as there may be a different bacterial organism present.  ?

## 2021-11-11 LAB — GC/CHLAMYDIA PROBE AMP (~~LOC~~) NOT AT ARMC
Chlamydia: NEGATIVE
Comment: NEGATIVE
Comment: NORMAL
Neisseria Gonorrhea: NEGATIVE

## 2022-01-19 ENCOUNTER — Other Ambulatory Visit: Payer: Self-pay

## 2022-01-19 ENCOUNTER — Encounter (HOSPITAL_BASED_OUTPATIENT_CLINIC_OR_DEPARTMENT_OTHER): Payer: Self-pay | Admitting: Emergency Medicine

## 2022-01-19 ENCOUNTER — Emergency Department (HOSPITAL_BASED_OUTPATIENT_CLINIC_OR_DEPARTMENT_OTHER)
Admission: EM | Admit: 2022-01-19 | Discharge: 2022-01-19 | Disposition: A | Discharge status: Home / Self Care | Payer: Medicaid Other | Attending: Emergency Medicine | Admitting: Emergency Medicine

## 2022-01-19 DIAGNOSIS — R519 Headache, unspecified: Secondary | ICD-10-CM | POA: Diagnosis present

## 2022-01-19 DIAGNOSIS — R220 Localized swelling, mass and lump, head: Secondary | ICD-10-CM | POA: Diagnosis not present

## 2022-01-19 MED ORDER — CLINDAMYCIN HCL 300 MG PO CAPS: 300.0000 mg | ORAL_CAPSULE | Freq: Three times a day (TID) | ORAL | 0 refills | Status: AC

## 2022-01-19 MED ORDER — KETOROLAC TROMETHAMINE 15 MG/ML IJ SOLN
30.0000 mg | Freq: Once | INTRAMUSCULAR | Status: AC
Start: 2022-01-19 — End: 2022-01-19
  Administered 2022-01-19: 30 mg via INTRAMUSCULAR
  Filled 2022-01-19: qty 2

## 2022-01-19 MED ORDER — FLUTICASONE PROPIONATE 50 MCG/ACT NA SUSP
2.0000 | Freq: Every day | NASAL | 12 refills | Status: AC
Start: 2022-01-19 — End: ?

## 2022-01-19 NOTE — ED Notes (Signed)
Patient refuses urine sample, states she is not pregnant, provider notified

## 2022-01-19 NOTE — ED Provider Notes (Signed)
[facility] EMERGENCY DEPARTMENT Provider Note   CSN: [id number] Arrival date & time: 01/19/22  1028     History  Chief Complaint  Patient presents with   Facial Pain    Kristi Shelton is a 31 y.o. female.  HPI 31 year old presents to the emergency department today for about 2 days of right-sided facial pain, swelling.  Patient reports that she is having pain to her teeth, sinuses and her ear.  Patient denies any congestion or rhinorrhea.  She denies any known tooth that could be infected.  Patient has had no fevers or chills.  Denies any vision changes, consensual photophobia or photophobia.  Patient has been taken Claritin and Advil for symptoms with little relief.  Nothing makes better or worse.  No known sick contacts.    Home Medications Prior to Admission medications   Medication Sig Start Date End Date Taking? Authorizing Provider  clindamycin (CLEOCIN) 300 MG capsule Take 1 capsule (300 mg total) by mouth 3 (three) times daily for 7 days. 01/19/22 01/26/22 Yes Keath Matera, Lynann BeaverKenneth T, PA-C  docusate sodium (COLACE) 100 MG capsule Take 1 capsule (100 mg total) by mouth 2 (two) times daily as needed for mild constipation or moderate constipation. Patient not taking: Reported on 07/13/2021 06/15/21   Orion CrookPassmore, Tewana I, NP  fluticasone (FLONASE) 50 MCG/ACT nasal spray Place 2 sprays into both nostrils daily. 01/19/22  Yes Anjelica Gorniak, Lynann BeaverKenneth T, PA-C  cetirizine (ZYRTEC) 10 MG chewable tablet Chew 1 tablet (10 mg total) by mouth daily. 07/13/21 10/11/21  Orion CrookPassmore, Tewana I, NP  EPINEPHrine 0.3 mg/0.3 mL IJ SOAJ injection Inject 0.3 mg into the muscle as needed for anaphylaxis. 06/07/21   Haskel SchroederBadalamente, Peter R, PA-C  ferrous sulfate 300 (60 Fe) MG/5ML syrup Take 5 mLs (300 mg total) by mouth daily. 06/16/21 10/14/21  Passmore, Enid Derryewana I, NP  LORazepam (ATIVAN) 0.5 MG tablet Take 1 tablet (0.5 mg total) by mouth 2 (two) times daily as needed for anxiety. Patient not taking: Reported on  07/13/2021 06/12/21   Orion CrookPassmore, Tewana I, NP  magnesium 30 MG tablet Take 30 mg by mouth 2 (two) times daily. Patient not taking: Reported on 06/12/2021    [provider]  Multiple Vitamin (MULTIVITAMIN) tablet Take 1 tablet by mouth daily.    [provider]  ondansetron (ZOFRAN) 4 MG tablet Take 1 tablet (4 mg total) by mouth every 6 (six) hours. Patient not taking: Reported on 07/13/2021 06/23/21   Virgina Norfolkuratolo, Adam, DO  pantoprazole (PROTONIX) 40 MG tablet Take 1 tablet (40 mg total) by mouth daily. 07/13/21   Passmore, Enid Derryewana I, NP  sucralfate (CARAFATE) 1 g tablet Take 1 tablet (1 g total) by mouth 4 (four) times daily -  with meals and at bedtime. 07/02/21   Geoffery Lyonselo, Douglas, MD      Allergies    Penicillins and Amoxil [amoxicillin]    Review of Systems   Review of Systems Please refer to the HPI Physical Exam Updated Vital Signs BP 104/62   Pulse 85   Temp 98.4 F (36.9 C) (Oral)   Resp 16   Ht 5\' 3"  (1.6 m)   Wt 63 kg   LMP 01/15/2022 (Exact Date)   SpO2 100%   BMI 24.60 kg/m  Physical Exam Vitals and nursing note reviewed.  Constitutional:      General: She is not in acute distress.    Appearance: She is well-developed.  HENT:     Head: Normocephalic and atraumatic.  Comments: Patient does have some mild edema over the right maxilla.  There are some mild gingival edema of the upper and lower gumline on the right side of the mouth.  There is no gross abscess appreciated.  No sublingual or submandibular swelling.  There is no erythema.  No warmth.  No lymphadenopathy appreciated.    Right Ear: Tympanic membrane, ear canal and external ear normal.     Left Ear: Tympanic membrane, ear canal and external ear normal.     Nose: Nose normal.     Mouth/Throat:     Mouth: Mucous membranes are moist.     Pharynx: Oropharynx is clear.  Eyes:     General: No scleral icterus.       Right eye: No discharge.        Left eye: No discharge.     Extraocular Movements:  Extraocular movements intact.     Pupils: Pupils are equal, round, and reactive to light.  Pulmonary:     Effort: No respiratory distress.  Musculoskeletal:        General: Normal range of motion.     Cervical back: Normal range of motion.  Skin:    Coloration: Skin is not pale.  Neurological:     Mental Status: She is alert.  Psychiatric:        Behavior: Behavior normal.        Thought Content: Thought content normal.        Judgment: Judgment normal.     ED Results / Procedures / Treatments   Labs (all labs ordered are listed, but only abnormal results are displayed) Labs Reviewed  PREGNANCY, URINE    EKG None  Radiology No results found.  Procedures Procedures    Medications Ordered in ED Medications  ketorolac (TORADOL) 15 MG/ML injection 30 mg (has no administration in time range)    ED Course/ Medical Decision Making/ A&P                           Medical Decision Making 31 year old presents for right-sided facial pain and swelling.  Suspect this is likely developing dental abscess.  Low suspicion for sinusitis given the patient has no congestion or rhinorrhea.  There is no evidence of otitis media.  No evidence of Ludwick's angina.  No evidence of peritonsillar abscess or deep space infection.  Patient's presentation not consistent with orbital cellulitis or preseptal cellulitis.  I will treat with clindamycin for possible developing dental abscess.  Patient given anti-inflammatories as well.  Discussed primary care and dental follow-up in the outpatient setting.  Discussed reasons to return to the ER.  Pt is hemodynamically stable, in NAD, & able to ambulate in the ED. Evaluation does not show pathology that would require ongoing emergent intervention or inpatient treatment. I explained the diagnosis to the patient. Pain has been managed & has no complaints prior to dc. Pt is comfortable with above plan and is stable for discharge at this time. All questions  were answered prior to disposition. Strict return precautions for f/u to the ED were discussed. Encouraged follow up with PCP.   Problems Addressed: Facial pain: self-limited or minor problem  Amount and/or Complexity of Data Reviewed Labs: ordered.  Risk Prescription drug management.           Final Clinical Impression(s) / ED Diagnoses Final diagnoses:  Facial pain    Rx / DC Orders ED Discharge Orders  Ordered    clindamycin (CLEOCIN) 300 MG capsule  3 times daily        01/19/22 1054    fluticasone (FLONASE) 50 MCG/ACT nasal spray  Daily        01/19/22 1055              Wallace Keller 01/19/22 1100    Alvira Monday, MD 01/19/22 [phone]

## 2022-01-19 NOTE — ED Triage Notes (Signed)
Pt states she had some congestion about 3 days ago and then stopped.  She then started to have facial pain, over both eyes, radiating to jaw.  Worse over right eye.   No known fever at home.

## 2022-01-19 NOTE — Discharge Instructions (Signed)
I am unsure if this is coming from a tooth or possibly from your sinus however we will treat you some antibiotics if it is possibly a dental abscess that could be developing.  I would recommend taking anti-inflammatories including Motrin, Aleve and Tylenol every 3 hours to help with pain and swelling.  Warm compresses over the right side of your face.  Have given you a nasal spray as well to help with any inflammation of your sinuses.  Make she follow-up if you not have any improvement in the next 48 hours with antibiotics.  Return to the ER sooner with any worsening symptoms.  Consider dental follow-up.

## 2022-09-06 ENCOUNTER — Emergency Department (HOSPITAL_BASED_OUTPATIENT_CLINIC_OR_DEPARTMENT_OTHER)
Admission: EM | Admit: 2022-09-06 | Discharge: 2022-09-06 | Discharge status: Against Medical Advice | Payer: Medicaid Other | Attending: Emergency Medicine | Admitting: Emergency Medicine

## 2022-09-06 ENCOUNTER — Other Ambulatory Visit: Payer: Self-pay

## 2022-09-06 ENCOUNTER — Encounter (HOSPITAL_BASED_OUTPATIENT_CLINIC_OR_DEPARTMENT_OTHER): Payer: Self-pay

## 2022-09-06 DIAGNOSIS — Z1152 Encounter for screening for COVID-19: Secondary | ICD-10-CM | POA: Diagnosis not present

## 2022-09-06 DIAGNOSIS — R1110 Vomiting, unspecified: Secondary | ICD-10-CM | POA: Insufficient documentation

## 2022-09-06 DIAGNOSIS — R0981 Nasal congestion: Secondary | ICD-10-CM | POA: Insufficient documentation

## 2022-09-06 DIAGNOSIS — E860 Dehydration: Secondary | ICD-10-CM | POA: Diagnosis not present

## 2022-09-06 DIAGNOSIS — M791 Myalgia, unspecified site: Secondary | ICD-10-CM | POA: Insufficient documentation

## 2022-09-06 DIAGNOSIS — Z5321 Procedure and treatment not carried out due to patient leaving prior to being seen by health care provider: Secondary | ICD-10-CM | POA: Insufficient documentation

## 2022-09-06 DIAGNOSIS — R059 Cough, unspecified: Secondary | ICD-10-CM | POA: Diagnosis not present

## 2022-09-06 LAB — RESP PANEL BY RT-PCR (RSV, FLU A&B, COVID)  RVPGX2
Influenza A by PCR: NEGATIVE
Influenza B by PCR: NEGATIVE
Resp Syncytial Virus by PCR: NEGATIVE
SARS Coronavirus 2 by RT PCR: NEGATIVE

## 2022-09-06 NOTE — ED Triage Notes (Signed)
Pt c/o body aches, dehydration, congestion, sinus pressure, "some" vomiting, runny nose, cough x1wk. Unknown sick contact, reports good PO.  Reports taking "off brand" sudafed, states last dose 2 days ago.

## 2022-09-06 NOTE — ED Triage Notes (Signed)
Called pt to take to room  No response from the lobby

## 2023-05-23 ENCOUNTER — Encounter (HOSPITAL_BASED_OUTPATIENT_CLINIC_OR_DEPARTMENT_OTHER): Payer: Self-pay

## 2023-05-23 ENCOUNTER — Other Ambulatory Visit: Payer: Self-pay

## 2023-05-23 ENCOUNTER — Emergency Department (HOSPITAL_BASED_OUTPATIENT_CLINIC_OR_DEPARTMENT_OTHER)
Admission: EM | Admit: 2023-05-23 | Discharge: 2023-05-23 | Disposition: A | Discharge status: Home / Self Care | Payer: Medicaid Other | Attending: Emergency Medicine | Admitting: Emergency Medicine

## 2023-05-23 DIAGNOSIS — N898 Other specified noninflammatory disorders of vagina: Secondary | ICD-10-CM | POA: Insufficient documentation

## 2023-05-23 DIAGNOSIS — K047 Periapical abscess without sinus: Secondary | ICD-10-CM | POA: Insufficient documentation

## 2023-05-23 LAB — URINALYSIS, ROUTINE W REFLEX MICROSCOPIC
Glucose, UA: NEGATIVE mg/dL
Hgb urine dipstick: NEGATIVE
Ketones, ur: NEGATIVE mg/dL
Leukocytes,Ua: NEGATIVE
Nitrite: NEGATIVE
Protein, ur: 30 mg/dL — AB
Specific Gravity, Urine: >=1.03 (ref 1.005–1.030)
pH: 6 (ref 5.0–8.0)

## 2023-05-23 LAB — URINALYSIS, MICROSCOPIC (REFLEX): RBC / HPF: NONE SEEN RBC/hpf (ref 0–5)

## 2023-05-23 LAB — WET PREP, GENITAL
Clue Cells Wet Prep HPF POC: NONE SEEN
Trich, Wet Prep: NONE SEEN
WBC, Wet Prep HPF POC: <10 (ref <10)
Yeast Wet Prep HPF POC: NONE SEEN

## 2023-05-23 LAB — PREGNANCY, URINE: Preg Test, Ur: NEGATIVE

## 2023-05-23 MED ORDER — CEFTRIAXONE SODIUM 500 MG IJ SOLR
500.0000 mg | Freq: Once | INTRAMUSCULAR | Status: AC
  Administered 2023-05-23: 500 mg via INTRAMUSCULAR
  Filled 2023-05-23: qty 500

## 2023-05-23 MED ORDER — DOXYCYCLINE HYCLATE 100 MG PO CAPS: 100.0000 mg | ORAL_CAPSULE | Freq: Two times a day (BID) | ORAL | 0 refills | Status: DC

## 2023-05-23 MED ORDER — LIDOCAINE HCL (PF) 1 % IJ SOLN
INTRAMUSCULAR | Status: AC
  Administered 2023-05-23: 1 mL
  Filled 2023-05-23: qty 5

## 2023-05-23 NOTE — ED Triage Notes (Signed)
Patient reports vaginal discharge with odor, and pain for 2 weeks. Denies pregnancy (tubal ligation). Patient also reports abscess in the mouth affecting the right upper side of the gum. Patient states it has bene there for a month. Patient has noticed drainage from the abscess.

## 2023-05-23 NOTE — ED Provider Notes (Signed)
[place] [facility]  Provider Note  CSN: [id number] Arrival date & time: 05/23/23 8295  History Chief Complaint  Patient presents with   Vaginal Discharge    Kristi Shelton is a 32 y.o. female here for evaluation of vaginal discharge and burning with urination for about 1-2 weeks. She is concerned about STI. She also reports some swelling and occasional drainage adjacent to broken R upper molar. No fevers.    Home Medications Prior to Admission medications   Medication Sig Start Date End Date Taking? Authorizing Provider  doxycycline (VIBRAMYCIN) 100 MG capsule Take 1 capsule (100 mg total) by mouth 2 (two) times daily. 05/23/23  Yes Pollyann Savoy, MD  cetirizine (ZYRTEC) 10 MG chewable tablet Chew 1 tablet (10 mg total) by mouth daily. 07/13/21 10/11/21  Orion Crook I, NP  EPINEPHrine 0.3 mg/0.3 mL IJ SOAJ injection Inject 0.3 mg into the muscle as needed for anaphylaxis. 06/07/21   Haskel Schroeder, PA-C  ferrous sulfate 300 (60 Fe) MG/5ML syrup Take 5 mLs (300 mg total) by mouth daily. 06/16/21 10/14/21  Passmore, Enid Derry I, NP  fluticasone (FLONASE) 50 MCG/ACT nasal spray Place 2 sprays into both nostrils daily. 01/19/22   Rise Mu, PA-C  Multiple Vitamin (MULTIVITAMIN) tablet Take 1 tablet by mouth daily.    [provider]  pantoprazole (PROTONIX) 40 MG tablet Take 1 tablet (40 mg total) by mouth daily. 07/13/21   Passmore, Enid Derry I, NP  sucralfate (CARAFATE) 1 g tablet Take 1 tablet (1 g total) by mouth 4 (four) times daily -  with meals and at bedtime. 07/02/21   Geoffery Lyons, MD     Allergies    Penicillins and Amoxil [amoxicillin]   Review of Systems   Review of Systems Please see HPI for pertinent positives and negatives  Physical Exam BP 115/74 (BP Location: Right Arm)   Pulse 98   Temp 98.9 F (37.2 C) (Oral)   Resp 15   Ht 5\' 3"  (1.6 m)   Wt 59.9 kg   SpO2 100%   BMI 23.38 kg/m    Physical Exam Vitals and nursing note reviewed.  HENT:     Head: Normocephalic.     Nose: Nose normal.     Mouth/Throat:     Comments: Small area of induration and erythema to R upper gum, 1st molar is eroded to gumline.  Eyes:     Extraocular Movements: Extraocular movements intact.  Pulmonary:     Effort: Pulmonary effort is normal.  Musculoskeletal:        General: Normal range of motion.     Cervical back: Neck supple.  Skin:    Findings: No rash (on exposed skin).  Neurological:     Mental Status: She is alert and oriented to person, place, and time.  Psychiatric:        Mood and Affect: Mood normal.     ED Results / Procedures / Treatments   EKG None  Procedures Procedures  Medications Ordered in the ED Medications  cefTRIAXone (ROCEPHIN) injection 500 mg (has no administration in time range)    Initial Impression and Plan  Patient here for two concerns, first is possible STI, self swab is pending. Also has evidence of dental infection.   ED Course   Clinical Course as of 05/23/23 0521  Mon May 23, 2023  0505 HCG is neg. Wet prep is negative. [CS]  0509 UA is clear.  [CS]  0515 Discussed  empiric treatment for STI, including Rocephin and oral doxycycline which should also adequately cover her dental infection as well. Recommend follow up at health dept or PCP/Gyn for recheck.  [CS]    Clinical Course User Index [CS] Pollyann Savoy, MD     MDM Rules/Calculators/A&P Medical Decision Making Problems Addressed: Dental abscess: acute illness or injury Vaginal discharge: acute illness or injury  Amount and/or Complexity of Data Reviewed Labs: ordered. Decision-making details documented in ED Course.  Risk Prescription drug management.     Final Clinical Impression(s) / ED Diagnoses Final diagnoses:  Vaginal discharge  Dental abscess    Rx / DC Orders ED Discharge Orders          Ordered    doxycycline (VIBRAMYCIN) 100 MG capsule  2  times daily        05/23/23 0521             Pollyann Savoy, MD 05/23/23 [id number]

## 2023-05-24 LAB — GC/CHLAMYDIA PROBE AMP (~~LOC~~) NOT AT ARMC
Chlamydia: NEGATIVE
Comment: NEGATIVE
Comment: NORMAL
Neisseria Gonorrhea: NEGATIVE

## 2023-06-20 ENCOUNTER — Emergency Department (HOSPITAL_COMMUNITY): Payer: Self-pay

## 2023-06-20 ENCOUNTER — Emergency Department (HOSPITAL_COMMUNITY)
Admission: EM | Admit: 2023-06-20 | Discharge: 2023-06-20 | Disposition: A | Discharge status: Home / Self Care | Payer: Self-pay | Attending: Emergency Medicine | Admitting: Emergency Medicine

## 2023-06-20 ENCOUNTER — Other Ambulatory Visit: Payer: Self-pay

## 2023-06-20 DIAGNOSIS — D649 Anemia, unspecified: Secondary | ICD-10-CM | POA: Insufficient documentation

## 2023-06-20 DIAGNOSIS — Z79899 Other long term (current) drug therapy: Secondary | ICD-10-CM | POA: Insufficient documentation

## 2023-06-20 DIAGNOSIS — R079 Chest pain, unspecified: Secondary | ICD-10-CM

## 2023-06-20 LAB — TROPONIN I (HIGH SENSITIVITY)
Troponin I (High Sensitivity): <2 ng/L (ref <18)
Troponin I (High Sensitivity): <2 ng/L (ref <18)

## 2023-06-20 LAB — BASIC METABOLIC PANEL
Anion gap: 7 (ref 5–15)
BUN: 9 mg/dL (ref 6–20)
CO2: 20 mmol/L — ABNORMAL LOW (ref 22–32)
Calcium: 8.8 mg/dL — ABNORMAL LOW (ref 8.9–10.3)
Chloride: 106 mmol/L (ref 98–111)
Creatinine, Ser: 0.6 mg/dL (ref 0.44–1.00)
GFR, Estimated: >60 mL/min (ref >60)
Glucose, Bld: 106 mg/dL — ABNORMAL HIGH (ref 70–99)
Potassium: 3.4 mmol/L — ABNORMAL LOW (ref 3.5–5.1)
Sodium: 133 mmol/L — ABNORMAL LOW (ref 135–145)

## 2023-06-20 LAB — CBC
HCT: 25.9 % — ABNORMAL LOW (ref 36.0–46.0)
Hemoglobin: 7 g/dL — ABNORMAL LOW (ref 12.0–15.0)
MCH: 18 pg — ABNORMAL LOW (ref 26.0–34.0)
MCHC: 27 g/dL — ABNORMAL LOW (ref 30.0–36.0)
MCV: 66.6 fL — ABNORMAL LOW (ref 80.0–100.0)
Platelets: 334 10*3/uL (ref 150–400)
RBC: 3.89 MIL/uL (ref 3.87–5.11)
RDW: 19.6 % — ABNORMAL HIGH (ref 11.5–15.5)
WBC: 8.6 10*3/uL (ref 4.0–10.5)
nRBC: 0 % (ref 0.0–0.2)

## 2023-06-20 LAB — PROTIME-INR
INR: 1 (ref 0.8–1.2)
Prothrombin Time: 13.7 s (ref 11.4–15.2)

## 2023-06-20 LAB — HCG, SERUM, QUALITATIVE: Preg, Serum: NEGATIVE

## 2023-06-20 MED ORDER — GERITOL TONIC PO LIQD: ORAL | 2 refills | Status: AC

## 2023-06-20 NOTE — Discharge Instructions (Signed)
Take your omeprazole daily for 10 days avoiding spicy, fatty and large meals during that time. No alcohol or tobacco during that time either.   Follow up with hematology for your low blood counts.

## 2023-06-20 NOTE — ED Triage Notes (Signed)
Pt arrives to ED c/o right sided CP and left arm and leg numbness x several hours. Pt reports eating meatloaf for dinner and then laying down to sleep when pain occurred. Pt with hx of GERD. Pt endorses burping and indigestion.

## 2023-06-20 NOTE — ED Provider Notes (Signed)
[place] [facility] AT [facility] Provider Note   CSN: [id number] Arrival date & time: 06/20/23  6962     History {Add pertinent medical, surgical, social history, OB history to HPI:1} Chief Complaint  Patient presents with   Chest Pain    Kristi Shelton is a 32 y.o. female.  32 yo F here with chest pain. Ate some hot fries and meatloaf. Layed down afterwrds and had some chest pain. Thought it might be reflux, got some omeprazole and aspirin and didn't improve initially. Since it was left sided and she had some left sided paresthesias she was worried about her heart so came here for eval. No nausea, vomiting, diaphoresis, new dyspnea or light headedness. Symptoms have all resolved now after being here a couple hours.   On review of her labs it does show that she has a worsening anemia. She states she was diagnosed awhile ago but couldn't swallow the iron pills well so never really took them. She denies having seen a hematologist but did see her PCP at the time who reportedly worked her up for cancer and other issues and ultimately started her on iron. She endorses cravings and eating ice, fatigue, tiredness and intermittent DOE. Also still has periods and are somewhat heavy. Will have intermittent episodes of unexplained bruising on calves but nowhere else.    Chest Pain      Home Medications Prior to Admission medications   Medication Sig Start Date End Date Taking? Authorizing Provider  Iron-Vitamins [place] Daily) LIQD Use per directions - likely one tablespoon daily after a meal 06/20/23  Yes Tonianne Fine, Barbara Cower, MD  cetirizine (ZYRTEC) 10 MG chewable tablet Chew 1 tablet (10 mg total) by mouth daily. 07/13/21 10/11/21  Orion Crook I, NP  doxycycline (VIBRAMYCIN) 100 MG capsule Take 1 capsule (100 mg total) by mouth 2 (two) times daily. 05/23/23   Pollyann Savoy, MD  EPINEPHrine 0.3 mg/0.3 mL IJ SOAJ injection Inject 0.3 mg into the muscle as needed for  anaphylaxis. 06/07/21   Haskel Schroeder, PA-C  ferrous sulfate 300 (60 Fe) MG/5ML syrup Take 5 mLs (300 mg total) by mouth daily. 06/16/21 10/14/21  Passmore, Enid Derry I, NP  fluticasone (FLONASE) 50 MCG/ACT nasal spray Place 2 sprays into both nostrils daily. 01/19/22   Rise Mu, PA-C  Multiple Vitamin (MULTIVITAMIN) tablet Take 1 tablet by mouth daily.    [provider]  pantoprazole (PROTONIX) 40 MG tablet Take 1 tablet (40 mg total) by mouth daily. 07/13/21   Passmore, Enid Derry I, NP  sucralfate (CARAFATE) 1 g tablet Take 1 tablet (1 g total) by mouth 4 (four) times daily -  with meals and at bedtime. 07/02/21   Geoffery Lyons, MD      Allergies    Penicillins and Amoxil [amoxicillin]    Review of Systems   Review of Systems  Cardiovascular:  Positive for chest pain.    Physical Exam Updated Vital Signs BP 102/76   Pulse 80   Temp 98 F (36.7 C)   Resp (!) 24   Ht 5\' 2"  (1.575 m)   Wt 61.2 kg   SpO2 100%   BMI 24.69 kg/m  Physical Exam  ED Results / Procedures / Treatments   Labs (all labs ordered are listed, but only abnormal results are displayed) Labs Reviewed  BASIC METABOLIC PANEL - Abnormal; Notable for the following components:      Result Value   Sodium 133 (*)    Potassium 3.4 (*)  CO2 20 (*)    Glucose, Bld 106 (*)    Calcium 8.8 (*)    All other components within normal limits  CBC - Abnormal; Notable for the following components:   Hemoglobin 7.0 (*)    HCT 25.9 (*)    MCV 66.6 (*)    MCH 18.0 (*)    MCHC 27.0 (*)    RDW 19.6 (*)    All other components within normal limits  HCG, SERUM, QUALITATIVE  PROTIME-INR  TROPONIN I (HIGH SENSITIVITY)  TROPONIN I (HIGH SENSITIVITY)    EKG None  Radiology DG Chest 2 View  Result Date: 06/20/2023 CLINICAL DATA:  Chest pain EXAM: CHEST - 2 VIEW COMPARISON:  07/02/2021 FINDINGS: The heart size and mediastinal contours are within normal limits. Both lungs are clear. The visualized  skeletal structures are unremarkable. Hair artifact projects over the right apex. IMPRESSION: No active cardiopulmonary disease. Electronically Signed   By: Minerva Fester M.D.   On: 06/20/2023 02:23    Procedures Procedures  {Document cardiac monitor, telemetry assessment procedure when appropriate:1}  Medications Ordered in ED Medications - No data to display  ED Course/ Medical Decision Making/ A&P   {   Click here for ABCD2, HEART and other calculatorsREFRESH Note before signing :1}                              Medical Decision Making Amount and/or Complexity of Data Reviewed Labs: ordered. Radiology: ordered.  Risk OTC drugs.   ***  {Document critical care time when appropriate:1} {Document review of labs and clinical decision tools ie heart score, Chads2Vasc2 etc:1}  {Document your independent review of radiology images, and any outside records:1} {Document your discussion with family members, caretakers, and with consultants:1} {Document social determinants of health affecting pt's care:1} {Document your decision making why or why not admission, treatments were needed:1} Final Clinical Impression(s) / ED Diagnoses Final diagnoses:  Anemia, unspecified type    Rx / DC Orders ED Discharge Orders          Ordered    Iron-Vitamins (GERITOL) LIQD        06/20/23 0413    Ambulatory referral to Hematology / Oncology       Comments: Your emergency department provider has referred you to see a hematology/oncology specialist. These are physicians who specialize in blood disorders and cancers, or findings concerning for cancer. You will receive a phone call from the [facility] to set up your appointment within 2 business days: [employer] operate Mon - Fri, 8:00 a.m. to 5:00 p.m.; closed for federally recognized holidays. Please be sure your phone is not set to block numbers during this time.   06/20/23 4098

## 2023-06-29 ENCOUNTER — Inpatient Hospital Stay: Payer: Self-pay | Admitting: Oncology

## 2023-06-29 ENCOUNTER — Inpatient Hospital Stay: Payer: Self-pay

## 2023-06-29 ENCOUNTER — Telehealth: Payer: Self-pay | Admitting: Oncology

## 2023-06-29 NOTE — Telephone Encounter (Signed)
Rescheduled appointment per incoming call. Patient is aware of the changes made to her upcoming appointments.

## 2023-06-29 NOTE — Progress Notes (Deleted)
[facility]  HEMATOLOGY CLINIC CONSULTATION NOTE    PATIENT NAME: Kristi Shelton   MR#: [id number] DOB: [birth year]  DATE OF SERVICE: 06/29/2023  Patient Care Team: Kathrynn Speed, NP (Inactive) as PCP - General (Nurse Practitioner)  REASON FOR CONSULTATION/ CHIEF COMPLAINT:  Evaluation of anemia.  HISTORY OF PRESENT ILLNESS:  Kristi Shelton is a 32 y.o. lady with a past medical history of GERD, was referred to our service for evaluation of anemia.  Discussed the use of AI scribe software for clinical note transcription with the patient, who gave verbal consent to proceed.   She presented to the ED on 06/20/2023 with complaints of left-sided chest pain/paresthesias.  Workup in the ED showed evidence of anemia with hemoglobin of 7.  It was felt to be from her heavy menstrual blood loss.  Patient was offered blood transfusion but she preferred iron supplementation.  She was prescribed oral iron and referral was sent to [place] for further evaluation of anemia.  ***She denies recent chest pain on exertion, shortness of breath on minimal exertion, pre-syncopal episodes, or palpitations. ***She had not noticed any recent bleeding such as epistaxis, hematuria or hematochezia ***The patient denies over the counter NSAID ingestion. She is not *** on antiplatelets agents. Her last colonoscopy was *** ***She had no prior history or diagnosis of cancer. Her age appropriate screening programs are up-to-date. ***She denies any pica and eats a variety of diet. ***She never donated blood or received blood transfusion ***The patient was prescribed oral iron supplements and she takes ***  MEDICAL HISTORY:  Past Medical History:  Diagnosis Date   Anemia    GERD (gastroesophageal reflux disease)    Tonsil stone     SURGICAL HISTORY: Past Surgical History:  Procedure Laterality Date   CESAREAN SECTION     TOOTH EXTRACTION     TUBAL LIGATION     WISDOM TOOTH EXTRACTION       SOCIAL HISTORY: She reports that she has never smoked. She has never used smokeless tobacco. She reports current alcohol use. She reports that she does not use drugs. Social History   Socioeconomic History   Marital status: Single    Spouse name: Not on file   Number of children: Not on file   Years of education: Not on file   Highest education level: Not on file  Occupational History   Not on file  Tobacco Use   Smoking status: Never   Smokeless tobacco: Never  Vaping Use   Vaping status: Never Used  Substance and Sexual Activity   Alcohol use: Yes    Comment: occ   Drug use: No   Sexual activity: Yes    Birth control/protection: None  Other Topics Concern   Not on file  Social History Narrative   Not on file   Social Determinants of Health   Financial Resource Strain: Not on file  Food Insecurity: Not on file  Transportation Needs: Not on file  Physical Activity: Not on file  Stress: Not on file  Social Connections: Not on file  Intimate Partner Violence: Not on file    FAMILY HISTORY: Family History  Problem Relation Age of Onset   Hypertension Mother    Cancer Mother    Diabetes Maternal Grandmother    Cancer Other    Diabetes Other    Hypertension Other     ALLERGIES:  She is allergic to penicillins and amoxil [amoxicillin].  MEDICATIONS:  Current Outpatient Medications  Medication Sig  Dispense Refill   cetirizine (ZYRTEC) 10 MG chewable tablet Chew 1 tablet (10 mg total) by mouth daily. 30 tablet 2   doxycycline (VIBRAMYCIN) 100 MG capsule Take 1 capsule (100 mg total) by mouth 2 (two) times daily. 20 capsule 0   EPINEPHrine 0.3 mg/0.3 mL IJ SOAJ injection Inject 0.3 mg into the muscle as needed for anaphylaxis. 1 each 0   ferrous sulfate 300 (60 Fe) MG/5ML syrup Take 5 mLs (300 mg total) by mouth daily. 150 mL 3   fluticasone (FLONASE) 50 MCG/ACT nasal spray Place 2 sprays into both nostrils daily. 16 g 12   Iron-Vitamins (GERITOL) LIQD Use  per directions - likely one tablespoon daily after a meal 354 mL 2   Multiple Vitamin (MULTIVITAMIN) tablet Take 1 tablet by mouth daily.     pantoprazole (PROTONIX) 40 MG tablet Take 1 tablet (40 mg total) by mouth daily. 30 tablet 3   sucralfate (CARAFATE) 1 g tablet Take 1 tablet (1 g total) by mouth 4 (four) times daily -  with meals and at bedtime. 90 tablet 0   No current facility-administered medications for this visit.    REVIEW OF SYSTEMS:    Review of Systems - Oncology  All other pertinent systems were reviewed and were negative except as mentioned above.  PHYSICAL EXAMINATION:  ECOG PERFORMANCE STATUS: {CHL ONC ECOG PS:[phone]}  There were no vitals filed for this visit. There were no vitals filed for this visit.  Physical Exam  ***  LABORATORY DATA:   I have reviewed the data as listed.  No results found for any visits on 06/29/23.  Lab Results  Component Value Date   WBC 8.6 06/20/2023   NEUTROABS 4.3 08/17/2021   HGB 7.0 (L) 06/20/2023   HCT 25.9 (L) 06/20/2023   MCV 66.6 (L) 06/20/2023   PLT 334 06/20/2023    Recent Labs    06/20/23 0158  NA 133*  K 3.4*  CL 106  CO2 20*  GLUCOSE 106*  BUN 9  CREATININE 0.60  CALCIUM 8.8*  GFRNONAA >60     RADIOGRAPHIC STUDIES:  I have personally reviewed the radiological images as listed and agree with the findings in the report.  DG Chest 2 View  Result Date: 06/20/2023 CLINICAL DATA:  Chest pain EXAM: CHEST - 2 VIEW COMPARISON:  07/02/2021 FINDINGS: The heart size and mediastinal contours are within normal limits. Both lungs are clear. The visualized skeletal structures are unremarkable. Hair artifact projects over the right apex. IMPRESSION: No active cardiopulmonary disease. Electronically Signed   By: Minerva Fester M.D.   On: 06/20/2023 02:23    *** No pertinent imaging studies available to review.   ASSESSMENT & PLAN:   No problem-specific Assessment & Plan notes found for this  encounter.   Since the cause of anemia seems to be obvious from iron deficiency, I am not pursuing extensive workup at this time.  If inadequate response to IV iron is noted, we will pursue workup to rule out other etiologies.  No orders of the defined types were placed in this encounter.    The total time spent in the appointment was {CHL ONC TIME VISIT - ZOXWR:[id number]} encounter with patients including review of chart and various tests results, discussions about plan of care and coordination of care plan.  I reviewed lab results and outside records for this visit and discussed relevant results with the patient. Diagnosis, plan of care and treatment options were also discussed in detail with the  patient. Opportunity provided to ask questions and answers provided to her apparent satisfaction. Provided instructions to call our clinic with any problems, questions or concerns prior to return visit. I recommended to continue follow-up with PCP and sub-specialists. She verbalized understanding and agreed with the plan. No barriers to learning was detected.   Future Appointments  Date Time Provider Department Center  06/30/2023 11:00 AM Elion Hocker, Archie Patten, MD [facility] None  06/30/2023 11:45 AM [facility] LAB [facility] None     Meryl Crutch, MD [facility] [address] [place] [address] Dept: [phone] Dept Fax: [phone]  06/29/2023 10:51 AM   This document was completed utilizing speech recognition software. Grammatical errors, random word insertions, pronoun errors, and incomplete sentences are an occasional consequence of this system due to software limitations, ambient noise, and hardware issues. Any formal questions or concerns about the content, text or information contained within the body of this dictation should be directly addressed to the provider for clarification.

## 2023-06-30 ENCOUNTER — Other Ambulatory Visit: Payer: Self-pay

## 2023-06-30 ENCOUNTER — Other Ambulatory Visit: Payer: Self-pay | Admitting: Oncology

## 2023-06-30 ENCOUNTER — Encounter: Payer: Self-pay | Admitting: Oncology

## 2023-06-30 ENCOUNTER — Inpatient Hospital Stay: Payer: Self-pay | Attending: Oncology | Admitting: Oncology

## 2023-06-30 VITALS — BP 102/67 | HR 77 | Temp 98.1°F | Resp 18 | Ht 62.0 in | Wt 136.9 lb

## 2023-06-30 DIAGNOSIS — Z79899 Other long term (current) drug therapy: Secondary | ICD-10-CM | POA: Insufficient documentation

## 2023-06-30 DIAGNOSIS — D509 Iron deficiency anemia, unspecified: Secondary | ICD-10-CM

## 2023-06-30 DIAGNOSIS — K219 Gastro-esophageal reflux disease without esophagitis: Secondary | ICD-10-CM | POA: Insufficient documentation

## 2023-06-30 DIAGNOSIS — N920 Excessive and frequent menstruation with regular cycle: Secondary | ICD-10-CM | POA: Insufficient documentation

## 2023-06-30 DIAGNOSIS — D500 Iron deficiency anemia secondary to blood loss (chronic): Secondary | ICD-10-CM

## 2023-06-30 LAB — CBC WITH DIFFERENTIAL/PLATELET
Abs Immature Granulocytes: 0.01 10*3/uL (ref 0.00–0.07)
Basophils Absolute: 0.1 10*3/uL (ref 0.0–0.1)
Basophils Relative: 1 %
Eosinophils Absolute: 0 10*3/uL (ref 0.0–0.5)
Eosinophils Relative: 1 %
HCT: 25.7 % — ABNORMAL LOW (ref 36.0–46.0)
Hemoglobin: 7 g/dL — ABNORMAL LOW (ref 12.0–15.0)
Immature Granulocytes: 0 %
Lymphocytes Relative: 28 %
Lymphs Abs: 1.7 10*3/uL (ref 0.7–4.0)
MCH: 18 pg — ABNORMAL LOW (ref 26.0–34.0)
MCHC: 27.2 g/dL — ABNORMAL LOW (ref 30.0–36.0)
MCV: 66.2 fL — ABNORMAL LOW (ref 80.0–100.0)
Monocytes Absolute: 0.3 10*3/uL (ref 0.1–1.0)
Monocytes Relative: 5 %
Neutro Abs: 3.9 10*3/uL (ref 1.7–7.7)
Neutrophils Relative %: 65 %
Platelets: 413 10*3/uL — ABNORMAL HIGH (ref 150–400)
RBC: 3.88 MIL/uL (ref 3.87–5.11)
RDW: 19.3 % — ABNORMAL HIGH (ref 11.5–15.5)
WBC: 6 10*3/uL (ref 4.0–10.5)
nRBC: 0 % (ref 0.0–0.2)

## 2023-06-30 LAB — IRON AND IRON BINDING CAPACITY (CC-WL,HP ONLY)
Iron: 5 ug/dL — ABNORMAL LOW (ref 28–170)
Saturation Ratios: 1 % — ABNORMAL LOW (ref 10.4–31.8)
TIBC: 542 ug/dL — ABNORMAL HIGH (ref 250–450)
UIBC: 537 ug/dL — ABNORMAL HIGH (ref 148–442)

## 2023-06-30 LAB — COMPREHENSIVE METABOLIC PANEL
ALT: 6 U/L (ref 0–44)
AST: 13 U/L — ABNORMAL LOW (ref 15–41)
Albumin: 4.1 g/dL (ref 3.5–5.0)
Alkaline Phosphatase: 59 U/L (ref 38–126)
Anion gap: 6 (ref 5–15)
BUN: 9 mg/dL (ref 6–20)
CO2: 25 mmol/L (ref 22–32)
Calcium: 9.3 mg/dL (ref 8.9–10.3)
Chloride: 106 mmol/L (ref 98–111)
Creatinine, Ser: 0.62 mg/dL (ref 0.44–1.00)
GFR, Estimated: >60 mL/min (ref >60)
Glucose, Bld: 90 mg/dL (ref 70–99)
Potassium: 3.6 mmol/L (ref 3.5–5.1)
Sodium: 137 mmol/L (ref 135–145)
Total Bilirubin: 0.5 mg/dL (ref <1.2)
Total Protein: 7.6 g/dL (ref 6.5–8.1)

## 2023-06-30 LAB — FERRITIN: Ferritin: 2 ng/mL — ABNORMAL LOW (ref 11–307)

## 2023-06-30 LAB — FOLATE: Folate: 21.4 ng/mL (ref >5.9)

## 2023-06-30 LAB — VITAMIN B12: Vitamin B-12: 499 pg/mL (ref 180–914)

## 2023-06-30 NOTE — Progress Notes (Signed)
[facility]  HEMATOLOGY CLINIC CONSULTATION NOTE    PATIENT NAME: Kristi Shelton   MR#: [id number] DOB: 21-Sep-1990  DATE OF SERVICE: 06/30/2023   Patient Care Team: Kathrynn Speed, NP (Inactive) as PCP - General (Nurse Practitioner)  REASON FOR CONSULTATION/ CHIEF COMPLAINT:  Evaluation of anemia.  HISTORY OF PRESENT ILLNESS:  Kristi Shelton is a 32 y.o. lady with a past medical history of GERD, was referred to our service for evaluation of anemia.  Discussed the use of AI scribe software for clinical note transcription with the patient, who gave verbal consent to proceed.   She presented to the ED on 06/20/2023 with complaints of left-sided chest pain/paresthesias.  Workup in the ED showed evidence of anemia with hemoglobin of 7.  It was felt to be from her heavy menstrual blood loss.  Patient was offered blood transfusion but she preferred iron supplementation.  She was prescribed oral iron and referral was sent to [place] for further evaluation of anemia.  She attributes to recent episode of chest pain to her GERD. She denies current chest pain and dyspnea, but reports feeling a little dizzy. Her hemoglobin at that time was 7, and remains at 7 today. She has not previously required a blood transfusion for her anemia, and has been managing it with over-the-counter liquid iron and Ensure drinks, although she has not taken the iron recently. She was given a prescription for iron during her recent emergency department visit, but has not filled it. She also reports having heavy menstrual cycles at times, but she has been regular and shorter recently. She denies passing a lot of clots. She is planning to go out of town tomorrow and is concerned about her ability to enjoy her vacation with her current anemia.   MEDICAL HISTORY:  Past Medical History:  Diagnosis Date   Anemia    GERD (gastroesophageal reflux disease)    Tonsil stone     SURGICAL HISTORY: Past  Surgical History:  Procedure Laterality Date   CESAREAN SECTION     TOOTH EXTRACTION     TUBAL LIGATION     WISDOM TOOTH EXTRACTION      SOCIAL HISTORY: She reports that she has never smoked. She has never used smokeless tobacco. She reports current alcohol use. She reports that she does not use drugs. Social History   Socioeconomic History   Marital status: Single    Spouse name: Not on file   Number of children: Not on file   Years of education: Not on file   Highest education level: Not on file  Occupational History   Not on file  Tobacco Use   Smoking status: Never   Smokeless tobacco: Never  Vaping Use   Vaping status: Never Used  Substance and Sexual Activity   Alcohol use: Yes    Comment: occ   Drug use: No   Sexual activity: Yes    Birth control/protection: None  Other Topics Concern   Not on file  Social History Narrative   Not on file   Social Determinants of Health   Financial Resource Strain: Not on file  Food Insecurity: Not on file  Transportation Needs: Not on file  Physical Activity: Not on file  Stress: Not on file  Social Connections: Not on file  Intimate Partner Violence: Not on file    FAMILY HISTORY: Family History  Problem Relation Age of Onset   Hypertension Mother    Cancer Mother    Diabetes Maternal  Grandmother    Cancer Other    Diabetes Other    Hypertension Other     ALLERGIES:  She is allergic to penicillins and amoxil [amoxicillin].  MEDICATIONS:  Current Outpatient Medications  Medication Sig Dispense Refill   omeprazole (PRILOSEC) 10 MG capsule Take 10 mg by mouth daily.     cetirizine (ZYRTEC) 10 MG chewable tablet Chew 1 tablet (10 mg total) by mouth daily. 30 tablet 2   doxycycline (VIBRAMYCIN) 100 MG capsule Take 1 capsule (100 mg total) by mouth 2 (two) times daily. (Patient not taking: Reported on 06/30/2023) 20 capsule 0   EPINEPHrine 0.3 mg/0.3 mL IJ SOAJ injection Inject 0.3 mg into the muscle as needed for  anaphylaxis. (Patient not taking: Reported on 06/30/2023) 1 each 0   ferrous sulfate 300 (60 Fe) MG/5ML syrup Take 5 mLs (300 mg total) by mouth daily. 150 mL 3   fluticasone (FLONASE) 50 MCG/ACT nasal spray Place 2 sprays into both nostrils daily. (Patient not taking: Reported on 06/30/2023) 16 g 12   Iron-Vitamins (GERITOL) LIQD Use per directions - likely one tablespoon daily after a meal 354 mL 2   Multiple Vitamin (MULTIVITAMIN) tablet Take 1 tablet by mouth daily. (Patient not taking: Reported on 06/30/2023)     pantoprazole (PROTONIX) 40 MG tablet Take 1 tablet (40 mg total) by mouth daily. (Patient not taking: Reported on 06/30/2023) 30 tablet 3   sucralfate (CARAFATE) 1 g tablet Take 1 tablet (1 g total) by mouth 4 (four) times daily -  with meals and at bedtime. (Patient not taking: Reported on 06/30/2023) 90 tablet 0   No current facility-administered medications for this visit.    REVIEW OF SYSTEMS:    Review of Systems - Oncology  All other pertinent systems were reviewed and were negative except as mentioned above.  PHYSICAL EXAMINATION:  ECOG PERFORMANCE STATUS: 1 - Symptomatic but completely ambulatory  Vitals:   06/30/23 1216  BP: 102/67  Pulse: 77  Resp: 18  Temp: 98.1 F (36.7 C)  SpO2: 100%   Filed Weights   06/30/23 1216  Weight: 136 lb 14.4 oz (62.1 kg)    Physical Exam Constitutional:      General: She is not in acute distress.    Appearance: Normal appearance.  HENT:     Head: Normocephalic and atraumatic.  Eyes:     General: No scleral icterus.    Conjunctiva/sclera: Conjunctivae normal.  Cardiovascular:     Rate and Rhythm: Normal rate and regular rhythm.     Heart sounds: Normal heart sounds.  Pulmonary:     Effort: Pulmonary effort is normal.     Breath sounds: Normal breath sounds.  Abdominal:     General: There is no distension.  Musculoskeletal:     Right lower leg: No edema.     Left lower leg: No edema.  Neurological:      General: No focal deficit present.     Mental Status: She is alert and oriented to person, place, and time.  Psychiatric:        Mood and Affect: Mood normal.        Behavior: Behavior normal.        Thought Content: Thought content normal.     LABORATORY DATA:   I have reviewed the data as listed.  Results for orders placed or performed in visit on 06/30/23  Iron and Iron Binding Capacity ([facility] only)  Result Value Ref Range   Iron 5 (L) 28 -  170 ug/dL   TIBC 161 (H) 096 - 045 ug/dL   Saturation Ratios 1 (L) 10.4 - 31.8 %   UIBC 537 (H) 148 - 442 ug/dL  CBC with Differential/Platelet  Result Value Ref Range   WBC 6.0 4.0 - 10.5 K/uL   RBC 3.88 3.87 - 5.11 MIL/uL   Hemoglobin 7.0 (L) 12.0 - 15.0 g/dL   HCT 40.9 (L) 81.1 - 91.4 %   MCV 66.2 (L) 80.0 - 100.0 fL   MCH 18.0 (L) 26.0 - 34.0 pg   MCHC 27.2 (L) 30.0 - 36.0 g/dL   RDW 78.2 (H) 95.6 - 21.3 %   Platelets 413 (H) 150 - 400 K/uL   nRBC 0.0 0.0 - 0.2 %   Neutrophils Relative % 65 %   Neutro Abs 3.9 1.7 - 7.7 K/uL   Lymphocytes Relative 28 %   Lymphs Abs 1.7 0.7 - 4.0 K/uL   Monocytes Relative 5 %   Monocytes Absolute 0.3 0.1 - 1.0 K/uL   Eosinophils Relative 1 %   Eosinophils Absolute 0.0 0.0 - 0.5 K/uL   Basophils Relative 1 %   Basophils Absolute 0.1 0.0 - 0.1 K/uL   WBC Morphology MORPHOLOGY UNREMARKABLE    Smear Review LARGE PLATELETS    Immature Granulocytes 0 %   Abs Immature Granulocytes 0.01 0.00 - 0.07 K/uL   Acanthocytes PRESENT    Ovalocytes PRESENT   Comprehensive metabolic panel  Result Value Ref Range   Sodium 137 135 - 145 mmol/L   Potassium 3.6 3.5 - 5.1 mmol/L   Chloride 106 98 - 111 mmol/L   CO2 25 22 - 32 mmol/L   Glucose, Bld 90 70 - 99 mg/dL   BUN 9 6 - 20 mg/dL   Creatinine, Ser 0.86 0.44 - 1.00 mg/dL   Calcium 9.3 8.9 - 57.8 mg/dL   Total Protein 7.6 6.5 - 8.1 g/dL   Albumin 4.1 3.5 - 5.0 g/dL   AST 13 (L) 15 - 41 U/L   ALT 6 0 - 44 U/L   Alkaline Phosphatase 59 38 - 126 U/L    Total Bilirubin 0.5 <1.2 mg/dL   GFR, Estimated >46 >96 mL/min   Anion gap 6 5 - 15     RADIOGRAPHIC STUDIES:  I have personally reviewed the radiological images as listed and agree with the findings in the report.  DG Chest 2 View  Result Date: 06/20/2023 CLINICAL DATA:  Chest pain EXAM: CHEST - 2 VIEW COMPARISON:  07/02/2021 FINDINGS: The heart size and mediastinal contours are within normal limits. Both lungs are clear. The visualized skeletal structures are unremarkable. Hair artifact projects over the right apex. IMPRESSION: No active cardiopulmonary disease. Electronically Signed   By: Minerva Fester M.D.   On: 06/20/2023 02:23     ASSESSMENT & PLAN:   32 y.o. lady with a past medical history of GERD, was referred to our service for evaluation of anemia.  Iron deficiency anemia due to chronic blood loss Persistent low hemoglobin (7) likely secondary to menstrual blood loss and inadequate iron supplementation. Mild dizziness reported. No history of blood transfusion.   -Hematocrit 25.7, MCV low at 66.2.  Platelet count slightly elevated at 413,000, likely reactive from iron deficiency.  -Iron studies show evidence of severe iron deficiency with iron saturation of 1%, iron decreased at 5, iron binding capacity increased at ferritin 42.  Ferritin pending.  CMP grossly unremarkable.  B12, folate pending.  -Since she presented late to our clinic, we  could not arrange PRBC transfusion today.  Patient is going out of town later tonight or early in the morning tomorrow.  Will arrange for PRBC transfusion on Monday or Tuesday next week if hemoglobin is still below 8.  -We will arrange for IV iron with Venofer x 5 doses, to be given weekly at our infusion center at [facility].  -Cause of iron deficiency seems to be from her menstrual blood loss.  No other obvious signs of bleeding.  -Encourage consistent oral iron supplementation and iron rich food intake.   Since the cause of  anemia seems to be obvious from iron deficiency, I am not pursuing extensive workup at this time.  If inadequate response to IV iron is noted, we will pursue workup to rule out other etiologies.  Orders Placed This Encounter  Procedures   CBC with Differential/Platelet    Standing Status:   Future    Standing Expiration Date:   06/29/2024   Ferritin    Standing Status:   Future    Standing Expiration Date:   06/29/2024   Iron and Iron Binding Capacity ([facility] only)    Standing Status:   Future    Standing Expiration Date:   06/29/2024     The total time spent in the appointment was 40 minutes encounter with patients including review of chart and various tests results, discussions about plan of care and coordination of care plan.  I reviewed lab results and outside records for this visit and discussed relevant results with the patient. Diagnosis, plan of care and treatment options were also discussed in detail with the patient. Opportunity provided to ask questions and answers provided to her apparent satisfaction. Provided instructions to call our clinic with any problems, questions or concerns prior to return visit. I recommended to continue follow-up with PCP and sub-specialists. She verbalized understanding and agreed with the plan. No barriers to learning was detected.   Future Appointments  Date Time Provider Department Center  07/04/2023  7:45 AM [facility] LAB [facility] None  07/05/2023  8:00 AM [facility] INFUSION [facility] None  08/31/2023 10:30 AM [facility] LAB [facility] None  08/31/2023 11:00 AM Emmalee Solivan, MD [facility] None      Meryl Crutch, MD [facility] [address] [place] [address] Dept: [phone] Dept Fax: [phone]  06/30/2023 1:36 PM   This document was completed utilizing speech recognition software. Grammatical errors, random word insertions, pronoun errors, and  incomplete sentences are an occasional consequence of this system due to software limitations, ambient noise, and hardware issues. Any formal questions or concerns about the content, text or information contained within the body of this dictation should be directly addressed to the provider for clarification.

## 2023-06-30 NOTE — Assessment & Plan Note (Addendum)
Persistent low hemoglobin (7) likely secondary to menstrual blood loss and inadequate iron supplementation. Mild dizziness reported. No history of blood transfusion.   -Hematocrit 25.7, MCV low at 66.2.  Platelet count slightly elevated at 413,000, likely reactive from iron deficiency.  -Iron studies show evidence of severe iron deficiency with iron saturation of 1%, iron decreased at 5, iron binding capacity increased at ferritin 42.  Ferritin pending.  CMP grossly unremarkable.  B12, folate pending.  -Since she presented late to our clinic, we could not arrange PRBC transfusion today.  Patient is going out of town later tonight or early in the morning tomorrow.  Will arrange for PRBC transfusion on Monday or Tuesday next week if hemoglobin is still below 8.  -We will arrange for IV iron with Venofer x 5 doses, to be given weekly at our infusion center at [facility].  -Cause of iron deficiency seems to be from her menstrual blood loss.  No other obvious signs of bleeding.  -Encourage consistent oral iron supplementation and iron rich food intake.

## 2023-07-04 ENCOUNTER — Other Ambulatory Visit: Payer: Self-pay | Admitting: Oncology

## 2023-07-04 ENCOUNTER — Inpatient Hospital Stay: Payer: Medicaid - Dental

## 2023-07-04 ENCOUNTER — Telehealth: Payer: Self-pay

## 2023-07-04 ENCOUNTER — Other Ambulatory Visit: Payer: Self-pay

## 2023-07-04 DIAGNOSIS — D500 Iron deficiency anemia secondary to blood loss (chronic): Secondary | ICD-10-CM

## 2023-07-04 LAB — CBC WITH DIFFERENTIAL (CANCER CENTER ONLY)
Abs Immature Granulocytes: 0.03 10*3/uL (ref 0.00–0.07)
Basophils Absolute: 0.1 10*3/uL (ref 0.0–0.1)
Basophils Relative: 1 %
Eosinophils Absolute: 0.1 10*3/uL (ref 0.0–0.5)
Eosinophils Relative: 1 %
HCT: 27.1 % — ABNORMAL LOW (ref 36.0–46.0)
Hemoglobin: 7.4 g/dL — ABNORMAL LOW (ref 12.0–15.0)
Immature Granulocytes: 0 %
Lymphocytes Relative: 27 %
Lymphs Abs: 2 10*3/uL (ref 0.7–4.0)
MCH: 18.1 pg — ABNORMAL LOW (ref 26.0–34.0)
MCHC: 27.3 g/dL — ABNORMAL LOW (ref 30.0–36.0)
MCV: 66.4 fL — ABNORMAL LOW (ref 80.0–100.0)
Monocytes Absolute: 0.5 10*3/uL (ref 0.1–1.0)
Monocytes Relative: 7 %
Neutro Abs: 4.6 10*3/uL (ref 1.7–7.7)
Neutrophils Relative %: 64 %
Platelet Count: 447 10*3/uL — ABNORMAL HIGH (ref 150–400)
RBC: 4.08 MIL/uL (ref 3.87–5.11)
RDW: 19.1 % — ABNORMAL HIGH (ref 11.5–15.5)
WBC Count: 7.2 10*3/uL (ref 4.0–10.5)
nRBC: 0 % (ref 0.0–0.2)

## 2023-07-04 LAB — PREPARE RBC (CROSSMATCH)

## 2023-07-04 NOTE — Telephone Encounter (Signed)
error 

## 2023-07-05 ENCOUNTER — Inpatient Hospital Stay: Payer: Medicaid - Dental

## 2023-07-05 ENCOUNTER — Other Ambulatory Visit: Payer: Self-pay

## 2023-07-05 ENCOUNTER — Other Ambulatory Visit: Payer: Self-pay | Admitting: *Deleted

## 2023-07-05 ENCOUNTER — Other Ambulatory Visit: Payer: Medicaid - Dental

## 2023-07-05 DIAGNOSIS — D500 Iron deficiency anemia secondary to blood loss (chronic): Secondary | ICD-10-CM

## 2023-07-05 DIAGNOSIS — D509 Iron deficiency anemia, unspecified: Secondary | ICD-10-CM

## 2023-07-05 LAB — ABO/RH: ABO/RH(D): O POS

## 2023-07-05 MED ORDER — SODIUM CHLORIDE 0.9% IV SOLUTION: 250.0000 mL | INTRAVENOUS | Status: DC

## 2023-07-05 MED ORDER — DIPHENHYDRAMINE HCL 25 MG PO CAPS
25.0000 mg | ORAL_CAPSULE | Freq: Once | ORAL | Status: AC
  Administered 2023-07-05: 25 mg via ORAL

## 2023-07-05 MED ORDER — ACETAMINOPHEN 325 MG PO TABS
650.0000 mg | ORAL_TABLET | Freq: Once | ORAL | Status: AC
  Administered 2023-07-05: 650 mg via ORAL

## 2023-07-05 NOTE — Patient Instructions (Signed)

## 2023-07-08 LAB — TYPE AND SCREEN
ABO/RH(D): O POS
Antibody Screen: NEGATIVE
Unit division: 0

## 2023-07-08 LAB — BPAM RBC
Blood Product Expiration Date: 202412272359
ISSUE DATE / TIME: 202411261029
Unit Type and Rh: 5100

## 2023-07-11 ENCOUNTER — Telehealth: Payer: Self-pay | Admitting: Pharmacy Technician

## 2023-07-11 ENCOUNTER — Other Ambulatory Visit: Payer: Self-pay | Admitting: Oncology

## 2023-07-11 NOTE — Telephone Encounter (Addendum)
Dr. Arlana Pouch, [place] Childes note:  Patient will be scheduled as soon as possible.  Auth Submission: UNINSURED - FREE DRUG Site of care: Site of care: [facility] Payer:uninsured Medication & CPT/J Code(s) submitted: Venofer (Iron Sucrose) J1756 Route of submission (phone, fax, portal):  Phone # Fax # Auth type:  Units/visits requested: 5 doses Reference number:  Approval from: 07/05/23 to 06/09/24

## 2023-07-11 NOTE — Telephone Encounter (Signed)
Dr. Arlana Pouch, Needing clarification.  Please clarify Venofer dose. 1.Venofer 500mg  x2 doses? 2. Venofer 200mg  wkly x5 doses?  Unfortunately x2 orders came across in the work que.  (Fyi: patient has been approved for free drug and we will schedule patient as soon as possible)

## 2023-07-12 ENCOUNTER — Encounter: Payer: Self-pay | Admitting: Oncology

## 2023-07-12 NOTE — Telephone Encounter (Signed)
Dr. Arlana Pouch, patient will be scheduled as soon as possible.  Auth Submission: NO AUTH NEEDED Site of care: Site of care: [facility] Payer: Not insured Medication & CPT/J Code(s) submitted: Venofer (Iron Sucrose) J1756 Units/visits requested: 200mg  x 5 doses Reference number: [id number] [employer] manufacturer assistance Approval from: 06/10/23 to 06/09/24   Patient is uninsured and has been approved for manufacturer assistance.

## 2023-07-15 ENCOUNTER — Encounter: Payer: Self-pay | Admitting: Oncology

## 2023-07-18 ENCOUNTER — Ambulatory Visit: Payer: Medicaid - Dental

## 2023-07-18 MED ORDER — ACETAMINOPHEN 325 MG PO TABS: 650.0000 mg | ORAL_TABLET | Freq: Once | ORAL | Status: DC

## 2023-07-18 MED ORDER — IRON SUCROSE 20 MG/ML IV SOLN: 200.0000 mg | Freq: Once | INTRAVENOUS | Status: DC

## 2023-07-18 MED ORDER — DIPHENHYDRAMINE HCL 25 MG PO CAPS: 25.0000 mg | ORAL_CAPSULE | Freq: Once | ORAL | Status: DC

## 2023-07-24 MED ORDER — DIPHENHYDRAMINE HCL 25 MG PO CAPS: 25.0000 mg | ORAL_CAPSULE | Freq: Once | ORAL | Status: DC

## 2023-07-24 MED ORDER — IRON SUCROSE 20 MG/ML IV SOLN: 200.0000 mg | Freq: Once | INTRAVENOUS | Status: DC

## 2023-07-24 MED ORDER — ACETAMINOPHEN 325 MG PO TABS: 650.0000 mg | ORAL_TABLET | Freq: Once | ORAL | Status: DC

## 2023-07-25 ENCOUNTER — Ambulatory Visit: Payer: Medicaid - Dental

## 2023-07-25 MED ORDER — ACETAMINOPHEN 325 MG PO TABS
650.0000 mg | ORAL_TABLET | Freq: Once | ORAL | Status: DC
Start: 2023-07-26 — End: 2023-08-01

## 2023-07-25 MED ORDER — IRON SUCROSE 20 MG/ML IV SOLN: 200.0000 mg | Freq: Once | INTRAVENOUS | Status: DC

## 2023-07-25 MED ORDER — DIPHENHYDRAMINE HCL 25 MG PO CAPS: 25.0000 mg | ORAL_CAPSULE | Freq: Once | ORAL | Status: DC

## 2023-07-26 ENCOUNTER — Ambulatory Visit: Payer: Medicaid - Dental

## 2023-07-26 DIAGNOSIS — D500 Iron deficiency anemia secondary to blood loss (chronic): Secondary | ICD-10-CM

## 2023-08-01 ENCOUNTER — Ambulatory Visit: Payer: Medicaid - Dental

## 2023-08-01 MED ORDER — ACETAMINOPHEN 325 MG PO TABS
650.0000 mg | ORAL_TABLET | Freq: Once | ORAL | Status: AC
Start: 2023-08-01 — End: ?

## 2023-08-01 MED ORDER — IRON SUCROSE 20 MG/ML IV SOLN
200.0000 mg | Freq: Once | INTRAVENOUS | Status: AC
Start: 2023-08-01 — End: ?

## 2023-08-01 MED ORDER — DIPHENHYDRAMINE HCL 25 MG PO CAPS
25.0000 mg | ORAL_CAPSULE | Freq: Once | ORAL | Status: AC
Start: 2023-08-01 — End: ?

## 2023-08-15 ENCOUNTER — Ambulatory Visit: Payer: Medicaid - Dental

## 2023-08-15 MED ORDER — DIPHENHYDRAMINE HCL 25 MG PO CAPS: 25.0000 mg | ORAL_CAPSULE | Freq: Once | ORAL | Status: DC

## 2023-08-15 MED ORDER — ACETAMINOPHEN 325 MG PO TABS: 650.0000 mg | ORAL_TABLET | Freq: Once | ORAL | Status: DC

## 2023-08-15 MED ORDER — IRON SUCROSE 20 MG/ML IV SOLN
200.0000 mg | Freq: Once | INTRAVENOUS | Status: DC
Start: 2023-08-15 — End: 2023-08-16

## 2023-08-16 ENCOUNTER — Encounter: Payer: Self-pay | Admitting: Oncology

## 2023-08-17 IMAGING — DX DG CHEST 1V PORT
1 series · 1 of 1 positions shown · non-contrast
Comparison: 05/24/2021

CLINICAL DATA: Pain

EXAM:
PORTABLE CHEST 1 VIEW

[chest ap]
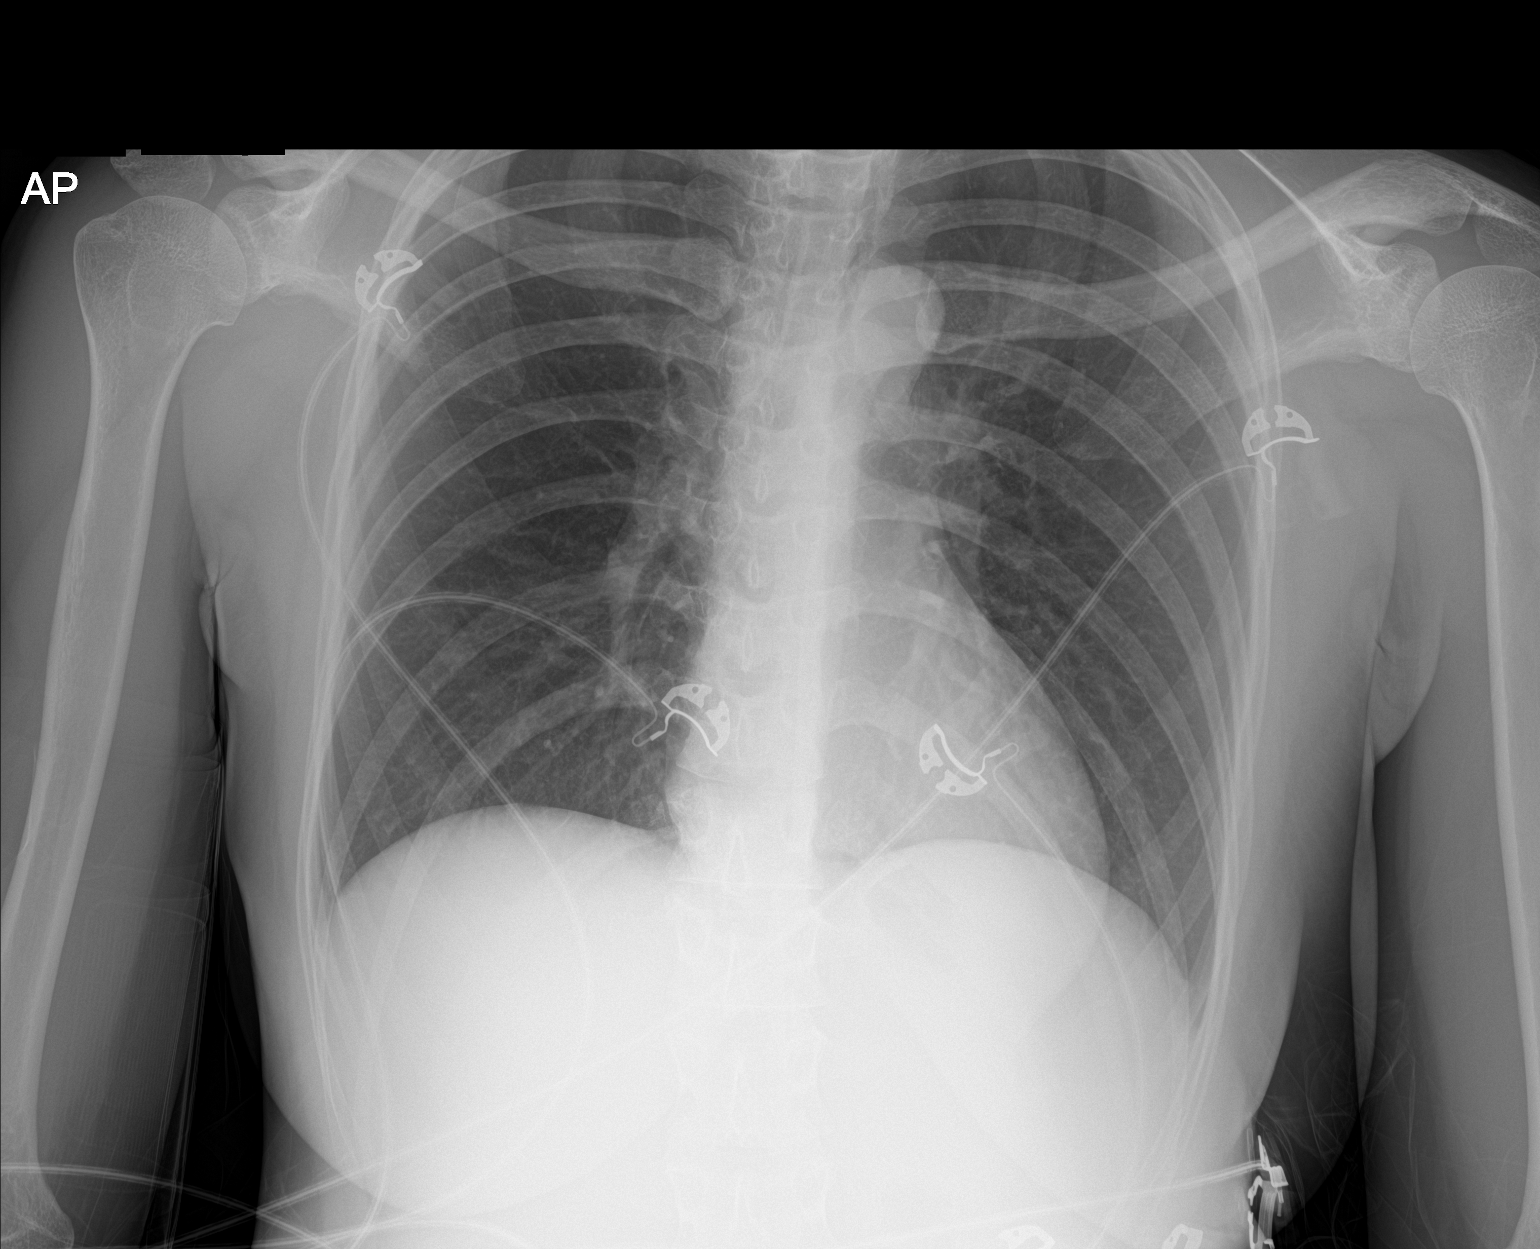

[1 of 1 positions shown; findings below may reference images not displayed]

FINDINGS: The heart size and mediastinal contours are within normal limits.
Both lungs are clear. No pleural effusion. No pneumothorax. The
visualized skeletal structures are unremarkable.
IMPRESSION: No acute process in the chest.

## 2023-08-22 ENCOUNTER — Ambulatory Visit: Payer: Medicaid - Dental

## 2023-08-22 MED ORDER — DIPHENHYDRAMINE HCL 25 MG PO CAPS
25.0000 mg | ORAL_CAPSULE | Freq: Once | ORAL | Status: DC
Start: 2023-08-22 — End: 2023-08-23

## 2023-08-22 MED ORDER — IRON SUCROSE 20 MG/ML IV SOLN
200.0000 mg | Freq: Once | INTRAVENOUS | Status: DC
Start: 2023-08-22 — End: 2023-08-23

## 2023-08-22 MED ORDER — ACETAMINOPHEN 325 MG PO TABS: 650.0000 mg | ORAL_TABLET | Freq: Once | ORAL | Status: DC

## 2023-08-23 ENCOUNTER — Encounter: Payer: Self-pay | Admitting: Oncology

## 2023-08-25 ENCOUNTER — Ambulatory Visit: Payer: Self-pay

## 2023-08-25 MED ORDER — ACETAMINOPHEN 325 MG PO TABS: 650.0000 mg | ORAL_TABLET | Freq: Once | ORAL | Status: AC

## 2023-08-25 MED ORDER — DIPHENHYDRAMINE HCL 25 MG PO CAPS: 25.0000 mg | ORAL_CAPSULE | Freq: Once | ORAL | Status: AC

## 2023-08-25 MED ORDER — IRON SUCROSE 20 MG/ML IV SOLN: 200.0000 mg | Freq: Once | INTRAVENOUS | Status: AC

## 2023-08-31 ENCOUNTER — Telehealth: Payer: Self-pay

## 2023-08-31 ENCOUNTER — Telehealth: Payer: Self-pay | Admitting: Oncology

## 2023-08-31 ENCOUNTER — Inpatient Hospital Stay: Payer: Self-pay | Attending: Oncology

## 2023-08-31 ENCOUNTER — Inpatient Hospital Stay: Payer: Self-pay | Admitting: Oncology

## 2023-08-31 NOTE — Telephone Encounter (Signed)
 Marland Kitchen

## 2023-08-31 NOTE — Telephone Encounter (Signed)
Left simple vm asking for return call to Dr. Zenda Alpers office to following up on missed appointment rescheduling.
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
## 2023-09-28 ENCOUNTER — Telehealth: Payer: Self-pay | Admitting: Physician Assistant

## 2023-09-28 DIAGNOSIS — J019 Acute sinusitis, unspecified: Secondary | ICD-10-CM

## 2023-09-28 DIAGNOSIS — B9689 Other specified bacterial agents as the cause of diseases classified elsewhere: Secondary | ICD-10-CM

## 2023-09-28 DIAGNOSIS — N898 Other specified noninflammatory disorders of vagina: Secondary | ICD-10-CM

## 2023-09-28 MED ORDER — DOXYCYCLINE HYCLATE 100 MG PO CAPS: 100.0000 mg | ORAL_CAPSULE | Freq: Two times a day (BID) | ORAL | 0 refills | Status: AC

## 2023-09-28 MED ORDER — METRONIDAZOLE 0.75 % VA GEL: 1.0000 | Freq: Two times a day (BID) | VAGINAL | 0 refills | Status: AC

## 2023-09-28 NOTE — Progress Notes (Signed)
 Virtual Visit Consent   Kristi Shelton, you are scheduled for a virtual visit with a [facility] provider today. Just as with appointments in the office, your consent must be obtained to participate. Your consent will be active for this visit and any virtual visit you may have with one of our providers in the next 365 days. If you have a MyChart account, a copy of this consent can be sent to you electronically.  As this is a virtual visit, video technology does not allow for your provider to perform a traditional examination. This may limit your provider's ability to fully assess your condition. If your provider identifies any concerns that need to be evaluated in person or the need to arrange testing (such as labs, EKG, etc.), we will make arrangements to do so. Although advances in technology are sophisticated, we cannot ensure that it will always work on either your end or our end. If the connection with a video visit is poor, the visit may have to be switched to a telephone visit. With either a video or telephone visit, we are not always able to ensure that we have a secure connection.  By engaging in this virtual visit, you consent to the provision of healthcare and authorize for your insurance to be billed (if applicable) for the services provided during this visit. Depending on your insurance coverage, you may receive a charge related to this service.  I need to obtain your verbal consent now. Are you willing to proceed with your visit today? Kristi Shelton has provided verbal consent on 09/28/2023 for a virtual visit (video or telephone). Kristi Shelton, [place]  Date: 09/28/2023 4:39 PM   Virtual Visit via Video Note   I, Kristi Shelton, connected with  Kristi Shelton  ([id number], [birth year]) on 09/28/23 at  4:45 PM EST by a video-enabled telemedicine application and verified that I am speaking with the correct person using two identifiers.  Location: Patient: Virtual Visit  Location Patient: Home Provider: Virtual Visit Location Provider: Home Office   I discussed the limitations of evaluation and management by telemedicine and the availability of in person appointments. The patient expressed understanding and agreed to proceed.    History of Present Illness: Kristi Shelton is a 33 y.o. who identifies as a female who was assigned female at birth, and is being seen today for multiple complaints.   Endorses some ongoing nasal and sinus congestion with sinus pain and tooth pain over the past couple of weeks. Notes also having pain around upper premolar that has some tooth decay. Intermittent low-grade fever now with thick nasal discharge.  Patient also notes some vaginal odor with discharge noted over the past couple of weeks. Endorses symptoms occurring after intercourse with a new partner, unprotected. LMP ended 09/10/2023. S/p tubal ligation.   HPI: HPI  Problems:  Patient Active Problem List   Diagnosis Date Noted   Iron deficiency anemia due to chronic blood loss 06/30/2023    Allergies:  Allergies  Allergen Reactions   Penicillins Anxiety and Anaphylaxis    Has patient had a PCN reaction causing immediate rash, facial/tongue/throat swelling, SOB or lightheadedness with hypotension: N Has patient had a PCN reaction causing severe rash involving mucus membranes or skin necrosis: N Has patient had a PCN reaction that required hospitalization: Y Has patient had a PCN reaction occurring within the last 10 years: Y If all of the above answers are "NO", then may proceed with Cephalosporin use.  Other reaction(s): unknown, Unknown,  Wheezing Has patient had a PCN reaction causing immediate rash, facial/tongue/throat swelling, SOB or lightheadedness with hypotension: N Has patient had a PCN reaction causing severe rash involving mucus membranes or skin necrosis: N Has patient had a PCN reaction that required hospitalization: Y Has patient had a PCN reaction  occurring within the last 10 years: Y If all of the above answers are "NO", then may proceed with Cephalosporin use.    Amoxil [Amoxicillin]    Medications:  Current Outpatient Medications:    cetirizine (ZYRTEC) 10 MG chewable tablet, Chew 1 tablet (10 mg total) by mouth daily., Disp: 30 tablet, Rfl: 2   EPINEPHrine 0.3 mg/0.3 mL IJ SOAJ injection, Inject 0.3 mg into the muscle as needed for anaphylaxis. (Patient not taking: Reported on 06/30/2023), Disp: 1 each, Rfl: 0   ferrous sulfate 300 (60 Fe) MG/5ML syrup, Take 5 mLs (300 mg total) by mouth daily., Disp: 150 mL, Rfl: 3   fluticasone (FLONASE) 50 MCG/ACT nasal spray, Place 2 sprays into both nostrils daily. (Patient not taking: Reported on 06/30/2023), Disp: 16 g, Rfl: 12   Iron-Vitamins (GERITOL) LIQD, Use per directions - likely one tablespoon daily after a meal, Disp: 354 mL, Rfl: 2   Multiple Vitamin (MULTIVITAMIN) tablet, Take 1 tablet by mouth daily. (Patient not taking: Reported on 06/30/2023), Disp: , Rfl:    omeprazole (PRILOSEC) 10 MG capsule, Take 10 mg by mouth daily., Disp: , Rfl:    pantoprazole (PROTONIX) 40 MG tablet, Take 1 tablet (40 mg total) by mouth daily. (Patient not taking: Reported on 06/30/2023), Disp: 30 tablet, Rfl: 3 No current facility-administered medications for this visit.  Facility-Administered Medications Ordered in Other Visits:    acetaminophen (TYLENOL) tablet 650 mg, 650 mg, Oral, Once, Pasam, Avinash, MD   acetaminophen (TYLENOL) tablet 650 mg, 650 mg, Oral, Once, Mannam, Praveen, MD   diphenhydrAMINE (BENADRYL) capsule 25 mg, 25 mg, Oral, Once, Pasam, Avinash, MD   diphenhydrAMINE (BENADRYL) capsule 25 mg, 25 mg, Oral, Once, Mannam, Praveen, MD   iron sucrose (VENOFER) injection 200 mg, 200 mg, Intravenous, Once, Pasam, Avinash, MD   iron sucrose (VENOFER) injection 200 mg, 200 mg, Intravenous, Once, Pasam, Avinash, MD  Observations/Objective: Patient is well-developed, well-nourished in no  acute distress.  Resting comfortably at home.  Head is normocephalic, atraumatic.  No labored breathing. Speech is clear and coherent with logical content.  Patient is alert and oriented at baseline.   Assessment and Plan: 1. Acute bacterial sinusitis (Primary)  Rx Doxycycline giving penicillin allergy.  Increase fluids.  Rest.  Saline nasal spray.  Probiotic.  Mucinex as directed.  Humidifier in bedroom. Can start OTC nasal steroid spray.  Call or return to clinic if symptoms are not improving.   2. Vaginal odor  Discussed various etiologies. BV most likely culprit but recommend in person for STI panel as a precaution giving new sexual partner. Transportation is a bit of an issue for her right now so agreed to treat empirically for BV with metrogrel but she is to follow-up with PCP for STI screen, ASAP if symptoms are not resolving with Metrogel for suspected BV.   Follow Up Instructions: I discussed the assessment and treatment plan with the patient. The patient was provided an opportunity to ask questions and all were answered. The patient agreed with the plan and demonstrated an understanding of the instructions.  A copy of instructions were sent to the patient via MyChart unless otherwise noted below.   The patient was advised to  call back or seek an in-person evaluation if the symptoms worsen or if the condition fails to improve as anticipated.    Kristi Climes, PA-C

## 2023-09-28 NOTE — Patient Instructions (Signed)
 Tempie Donning, thank you for joining Piedad Climes, PA-C for today's virtual visit.  While this provider is not your primary care provider (PCP), if your PCP is located in our provider database this encounter information will be shared with them immediately following your visit.   A [facility] MyChart account gives you access to today's visit and all your visits, tests, and labs performed at [facility] " click here if you don't have a [facility] MyChart account or go to [email or web address]  Consent: (Patient) Tempie Donning provided verbal consent for this virtual visit at the beginning of the encounter.  Current Medications:  Current Outpatient Medications:    cetirizine (ZYRTEC) 10 MG chewable tablet, Chew 1 tablet (10 mg total) by mouth daily., Disp: 30 tablet, Rfl: 2   doxycycline (VIBRAMYCIN) 100 MG capsule, Take 1 capsule (100 mg total) by mouth 2 (two) times daily. (Patient not taking: Reported on 06/30/2023), Disp: 20 capsule, Rfl: 0   EPINEPHrine 0.3 mg/0.3 mL IJ SOAJ injection, Inject 0.3 mg into the muscle as needed for anaphylaxis. (Patient not taking: Reported on 06/30/2023), Disp: 1 each, Rfl: 0   ferrous sulfate 300 (60 Fe) MG/5ML syrup, Take 5 mLs (300 mg total) by mouth daily., Disp: 150 mL, Rfl: 3   fluticasone (FLONASE) 50 MCG/ACT nasal spray, Place 2 sprays into both nostrils daily. (Patient not taking: Reported on 06/30/2023), Disp: 16 g, Rfl: 12   Iron-Vitamins (GERITOL) LIQD, Use per directions - likely one tablespoon daily after a meal, Disp: 354 mL, Rfl: 2   Multiple Vitamin (MULTIVITAMIN) tablet, Take 1 tablet by mouth daily. (Patient not taking: Reported on 06/30/2023), Disp: , Rfl:    omeprazole (PRILOSEC) 10 MG capsule, Take 10 mg by mouth daily., Disp: , Rfl:    pantoprazole (PROTONIX) 40 MG tablet, Take 1 tablet (40 mg total) by mouth daily. (Patient not taking: Reported on 06/30/2023), Disp: 30 tablet, Rfl: 3   sucralfate (CARAFATE)  1 g tablet, Take 1 tablet (1 g total) by mouth 4 (four) times daily -  with meals and at bedtime. (Patient not taking: Reported on 06/30/2023), Disp: 90 tablet, Rfl: 0 No current facility-administered medications for this visit.  Facility-Administered Medications Ordered in Other Visits:    acetaminophen (TYLENOL) tablet 650 mg, 650 mg, Oral, Once, Pasam, Avinash, MD   acetaminophen (TYLENOL) tablet 650 mg, 650 mg, Oral, Once, Mannam, Praveen, MD   diphenhydrAMINE (BENADRYL) capsule 25 mg, 25 mg, Oral, Once, Pasam, Avinash, MD   diphenhydrAMINE (BENADRYL) capsule 25 mg, 25 mg, Oral, Once, Mannam, Praveen, MD   iron sucrose (VENOFER) injection 200 mg, 200 mg, Intravenous, Once, Pasam, Avinash, MD   iron sucrose (VENOFER) injection 200 mg, 200 mg, Intravenous, Once, Pasam, Avinash, MD   Medications ordered in this encounter:  No orders of the defined types were placed in this encounter.    *If you need refills on other medications prior to your next appointment, please contact your pharmacy*  Follow-Up: Call back or seek an in-person evaluation if the symptoms worsen or if the condition fails to improve as anticipated.  [facility] [phone]  Other Instructions Please take antibiotic as directed.  Increase fluid intake.  Use Saline nasal spray.  Take a daily multivitamin. You can use OTC Flonase to help with nasal congestion as well. Consider OTC mucinex Sinus.  Place a humidifier in the bedroom.  Please call or return clinic if symptoms are not improving.  Sinusitis Sinusitis is redness, soreness, and swelling (  inflammation) of the paranasal sinuses. Paranasal sinuses are air pockets within the bones of your face (beneath the eyes, the middle of the forehead, or above the eyes). In healthy paranasal sinuses, mucus is able to drain out, and air is able to circulate through them by way of your nose. However, when your paranasal sinuses are inflamed, mucus and air can become  trapped. This can allow bacteria and other germs to grow and cause infection. Sinusitis can develop quickly and last only a short time (acute) or continue over a long period (chronic). Sinusitis that lasts for more than 12 weeks is considered chronic.  CAUSES  Causes of sinusitis include: Allergies. Structural abnormalities, such as displacement of the cartilage that separates your nostrils (deviated septum), which can decrease the air flow through your nose and sinuses and affect sinus drainage. Functional abnormalities, such as when the small hairs (cilia) that line your sinuses and help remove mucus do not work properly or are not present. SYMPTOMS  Symptoms of acute and chronic sinusitis are the same. The primary symptoms are pain and pressure around the affected sinuses. Other symptoms include: Upper toothache. Earache. Headache. Bad breath. Decreased sense of smell and taste. A cough, which worsens when you are lying flat. Fatigue. Fever. Thick drainage from your nose, which often is green and may contain pus (purulent). Swelling and warmth over the affected sinuses. DIAGNOSIS  Your caregiver will perform a physical exam. During the exam, your caregiver may: Look in your nose for signs of abnormal growths in your nostrils (nasal polyps). Tap over the affected sinus to check for signs of infection. View the inside of your sinuses (endoscopy) with a special imaging device with a light attached (endoscope), which is inserted into your sinuses. If your caregiver suspects that you have chronic sinusitis, one or more of the following tests may be recommended: Allergy tests. Nasal culture A sample of mucus is taken from your nose and sent to a lab and screened for bacteria. Nasal cytology A sample of mucus is taken from your nose and examined by your caregiver to determine if your sinusitis is related to an allergy. TREATMENT  Most cases of acute sinusitis are related to a viral infection  and will resolve on their own within 10 days. Sometimes medicines are prescribed to help relieve symptoms (pain medicine, decongestants, nasal steroid sprays, or saline sprays).  However, for sinusitis related to a bacterial infection, your caregiver will prescribe antibiotic medicines. These are medicines that will help kill the bacteria causing the infection.  Rarely, sinusitis is caused by a fungal infection. In theses cases, your caregiver will prescribe antifungal medicine. For some cases of chronic sinusitis, surgery is needed. Generally, these are cases in which sinusitis recurs more than 3 times per year, despite other treatments. HOME CARE INSTRUCTIONS  Drink plenty of water. Water helps thin the mucus so your sinuses can drain more easily. Use a humidifier. Inhale steam 3 to 4 times a day (for example, sit in the bathroom with the shower running). Apply a warm, moist washcloth to your face 3 to 4 times a day, or as directed by your caregiver. Use saline nasal sprays to help moisten and clean your sinuses. Take over-the-counter or prescription medicines for pain, discomfort, or fever only as directed by your caregiver. SEEK IMMEDIATE MEDICAL CARE IF: You have increasing pain or severe headaches. You have nausea, vomiting, or drowsiness. You have swelling around your face. You have vision problems. You have a stiff neck. You  have difficulty breathing. MAKE SURE YOU:  Understand these instructions. Will watch your condition. Will get help right away if you are not doing well or get worse. Document Released: 07/26/2005 Document Revised: 10/18/2011 Document Reviewed: 08/10/2011 [facility] Patient Information 2014 [place], [place].    If you have been instructed to have an in-person evaluation today at a local Urgent Care facility, please use the link below. It will take you to a list of all of our available [facility], including address, phone number and hours of operation.  Please do not delay care.  [facility]  If you or a family member do not have a primary care provider, use the link below to schedule a visit and establish care. When you choose a [facility] primary care physician or advanced practice provider, you gain a long-term partner in health. Find a Primary Care Provider  Learn more about [facility]'s in-office and virtual care options: [facility] - Get Care Now

## 2024-04-22 ENCOUNTER — Emergency Department (HOSPITAL_BASED_OUTPATIENT_CLINIC_OR_DEPARTMENT_OTHER)

## 2024-04-22 ENCOUNTER — Other Ambulatory Visit: Payer: Self-pay

## 2024-04-22 ENCOUNTER — Encounter (HOSPITAL_BASED_OUTPATIENT_CLINIC_OR_DEPARTMENT_OTHER): Payer: Self-pay | Admitting: Emergency Medicine

## 2024-04-22 ENCOUNTER — Emergency Department (HOSPITAL_BASED_OUTPATIENT_CLINIC_OR_DEPARTMENT_OTHER)
Admission: EM | Admit: 2024-04-22 | Discharge: 2024-04-22 | Disposition: A | Discharge status: Home / Self Care | Attending: Emergency Medicine | Admitting: Emergency Medicine

## 2024-04-22 DIAGNOSIS — M546 Pain in thoracic spine: Secondary | ICD-10-CM | POA: Diagnosis not present

## 2024-04-22 DIAGNOSIS — N898 Other specified noninflammatory disorders of vagina: Secondary | ICD-10-CM | POA: Diagnosis present

## 2024-04-22 DIAGNOSIS — K5900 Constipation, unspecified: Secondary | ICD-10-CM

## 2024-04-22 DIAGNOSIS — R0602 Shortness of breath: Secondary | ICD-10-CM | POA: Insufficient documentation

## 2024-04-22 LAB — WET PREP, GENITAL
Clue Cells Wet Prep HPF POC: NONE SEEN
Sperm: NONE SEEN
Trich, Wet Prep: NONE SEEN
WBC, Wet Prep HPF POC: <10 (ref <10)
Yeast Wet Prep HPF POC: NONE SEEN

## 2024-04-22 MED ORDER — POLYETHYLENE GLYCOL 3350 17 GM/SCOOP PO POWD: 17.0000 g | Freq: Every day | ORAL | 0 refills | Status: AC | PRN

## 2024-04-22 MED ORDER — KETOROLAC TROMETHAMINE 30 MG/ML IJ SOLN
30.0000 mg | Freq: Once | INTRAMUSCULAR | Status: DC
Start: 2024-04-22 — End: 2024-04-22

## 2024-04-22 MED ORDER — KETOROLAC TROMETHAMINE 60 MG/2ML IM SOLN
30.0000 mg | Freq: Once | INTRAMUSCULAR | Status: AC
Start: 2024-04-22 — End: 2024-04-22
  Administered 2024-04-22: 30 mg via INTRAMUSCULAR
  Filled 2024-04-22: qty 2

## 2024-04-22 NOTE — ED Triage Notes (Signed)
 Mid back pain x 1 week , shortness of breath last night . A little a bit of coughing she said . Denies chest pain .  Back pain worse with movement .

## 2024-04-22 NOTE — ED Provider Notes (Signed)
 Kristi Shelton [facility] DEPARTMENT AT [facility] Provider Note   CSN: [id number] Arrival date & time: 04/22/24  9166     Patient presents with: Shortness of Breath and Back Pain   Kristi Shelton is a 33 y.o. female presenting with several concerns.  The patient reports she has had a pain under her left shoulder ongoing for about a week.  She feels that she strained her self or lifted too much at work that day as she began having the soreness after she left work.  She says she is having some pleuritic pain now that began afterwards, with a dry cough, some nasal congestion.  She reports she has IBS and chronic constipation and has not had a good bowel movement in 5 or 6 days, but she is passing gas.  Denies persistent vomiting.  She has not tried any laxatives at home for this.  She denies dysuria or hematuria.  She is not on her menstrual cycle.  She does report a malodor vaginal discharge and reports a history of BV in the past.  She does want screening for STI.  Denies fevers or chills   HPI     Prior to Admission medications   Medication Sig Start Date End Date Taking? Authorizing Provider  cetirizine  (ZYRTEC ) 10 MG chewable tablet Chew 1 tablet (10 mg total) by mouth daily. 07/13/21 10/11/21  Shannan Sia I, NP  doxycycline  (VIBRAMYCIN ) 100 MG capsule Take 1 capsule (100 mg total) by mouth 2 (two) times daily. 09/28/23   Gladis Elsie BROCKS, PA-C  EPINEPHrine  0.3 mg/0.3 mL IJ SOAJ injection Inject 0.3 mg into the muscle as needed for anaphylaxis. Patient not taking: Reported on 06/30/2023 06/07/21   Eudelia Maude SAUNDERS, PA-C  ferrous sulfate  300 (60 Fe) MG/5ML syrup Take 5 mLs (300 mg total) by mouth daily. 06/16/21 10/14/21  Shannan Sia I, NP  fluticasone  (FLONASE ) 50 MCG/ACT nasal spray Place 2 sprays into both nostrils daily. Patient not taking: Reported on 06/30/2023 01/19/22   Annabell Kent T, PA-C  Iron -Vitamins REGGY) LIQD Use per directions - likely one  tablespoon daily after a meal 06/20/23   Mesner, Selinda, MD  metroNIDAZOLE  (METROGEL ) 0.75 % vaginal gel Place 1 Applicatorful vaginally 2 (two) times daily. 09/28/23   Gladis Elsie BROCKS, PA-C  Multiple Vitamin (MULTIVITAMIN) tablet Take 1 tablet by mouth daily. Patient not taking: Reported on 06/30/2023    [provider]  omeprazole (PRILOSEC) 10 MG capsule Take 10 mg by mouth daily.    [provider]  pantoprazole  (PROTONIX ) 40 MG tablet Take 1 tablet (40 mg total) by mouth daily. Patient not taking: Reported on 06/30/2023 07/13/21   Shannan Sia I, NP    Allergies: Penicillins and Amoxil [amoxicillin]    Review of Systems  Updated Vital Signs BP 103/76 (BP Location: Left Arm)   Pulse 94   Temp 97.6 F (36.4 C)   Resp 18   Wt 70.8 kg   SpO2 100%   BMI 28.53 kg/m   Physical Exam Constitutional:      General: She is not in acute distress. HENT:     Head: Normocephalic and atraumatic.  Eyes:     Conjunctiva/sclera: Conjunctivae normal.     Pupils: Pupils are equal, round, and reactive to light.  Cardiovascular:     Rate and Rhythm: Normal rate and regular rhythm.  Pulmonary:     Effort: Pulmonary effort is normal. No respiratory distress.  Abdominal:     General: There is no  distension.     Tenderness: There is no abdominal tenderness.  Musculoskeletal:     Comments: Focal tenderness beneath the left scapula, worse with palpation, no spinal midline tenderness, patient is able to fully perform range of motion testing of the upper extremity  Skin:    General: Skin is warm and dry.  Neurological:     General: No focal deficit present.     Mental Status: She is alert. Mental status is at baseline.  Psychiatric:        Mood and Affect: Mood normal.        Behavior: Behavior normal.     (all labs ordered are listed, but only abnormal results are displayed) Labs Reviewed  WET PREP, GENITAL  GC/CHLAMYDIA PROBE AMP ([place]) NOT AT [facility]     EKG: None  Radiology: DG Chest 2 View Result Date: 04/22/2024 EXAM: 2 VIEW(S) XRAY OF THE CHEST 04/22/2024 09:43:13 AM COMPARISON: 06/20/2023 CLINICAL HISTORY: Shortness of breath. Mid back pain x 1 week, shortness of breath last night. A little bit of coughing. Denies chest pain. Back pain worse with movement. FINDINGS: LUNGS AND PLEURA: No focal pulmonary opacity. No pulmonary edema. No pleural effusion. No pneumothorax. HEART AND MEDIASTINUM: No acute abnormality of the cardiac and mediastinal silhouettes. BONES AND SOFT TISSUES: No acute osseous abnormality. IMPRESSION: 1. No acute process. Electronically signed by: Katheleen Faes MD 04/22/2024 10:01 AM EDT RP Workstation: [id number]     Procedures   Medications Ordered in the ED  ketorolac  (TORADOL ) injection 30 mg (30 mg Intramuscular Given 04/22/24 0939)                                    Medical Decision Making Amount and/or Complexity of Data Reviewed Labs: ordered. Radiology: ordered.  Risk Prescription drug management.   Patient is here with several complaints She may have a viral syndrome she reports congestion and dry cough.  Her lungs are clear to auscultation has no evidence of wheezing and no focal findings to raise concern for pneumonia.  I did obtain an x-ray to evaluate for potential small pneumothorax, reviewed this personally, notable for no emergent findings  Most likely suspect this is muscular pain.  She has focal tenderness on exam.  She may have strained a muscle here.  I recommended NSAIDs at home which she has not been taking anything other than low-dose Tylenol .  For her abdominal discomfort, which is intermittent, I suspect this is related to IBS and constipation.  She is passing gas and I doubt there is a bowel obstruction.  No indication for CT imaging.  Doubt intra-abdominal surgical infection or emergency.  I recommended drinking more water, she drinks very little water, and also increasing her  fiber intake and consider a laxative at home.  We will prescribe her MiraLAX .  She is comfortable with this plan.  Doubt kidney infection.   Doubt PE.  No indication for CT angiogram.  Patient is PERC negative.  Doubt ACS.  Doubt aortic dissection.  Doubt sepsis  Patient preferred to self swabbing.  I reviewed her wet prep results, notable for no emergent finding.  We will send off GC chlamydia testing, but overall low risk for these infections and we will defer empiric treatment until results return.  Patient is comfortable with this     Final diagnoses:  Vaginal discharge  Acute left-sided thoracic back pain    ED Discharge  Orders     None          Cottie Donnice PARAS, MD 04/22/24 1013

## 2024-04-22 NOTE — Discharge Instructions (Addendum)
 You may have a strained muscle in your back under your shoulder blade.  I recommend using ibuprofen  at home, over-the-counter, as well as Tylenol  as needed for pain.  This will generally improve in 1 to 2 weeks.  You can also use muscle rubs over-the-counter on the spot.  Please follow-up on the results of your vaginal swabs for chlamydia and gonorrhea.  If you test positive for either of these infections, you will likely receive a phone call directing you to treatment.  You will need to notify your prior sexual partners to be tested and treated.  Please refrain from any sex until you have the results back.

## 2024-04-23 LAB — GC/CHLAMYDIA PROBE AMP (~~LOC~~) NOT AT ARMC
Chlamydia: NEGATIVE
Comment: NEGATIVE
Comment: NORMAL
Neisseria Gonorrhea: NEGATIVE
